# Patient Record
Sex: Female | Born: 1960 | Race: White | Hispanic: No | Marital: Married | Smoking: Former smoker
Health system: Southern US, Community
[De-identification: ages and names within clinical notes are randomized; demographics above are authoritative.]

## PROBLEM LIST (undated history)

## (undated) DIAGNOSIS — E119 Type 2 diabetes mellitus without complications: Secondary | ICD-10-CM

## (undated) DIAGNOSIS — F32A Depression, unspecified: Secondary | ICD-10-CM

## (undated) DIAGNOSIS — K219 Gastro-esophageal reflux disease without esophagitis: Secondary | ICD-10-CM

## (undated) DIAGNOSIS — F419 Anxiety disorder, unspecified: Secondary | ICD-10-CM

## (undated) DIAGNOSIS — R011 Cardiac murmur, unspecified: Secondary | ICD-10-CM

## (undated) DIAGNOSIS — M722 Plantar fascial fibromatosis: Secondary | ICD-10-CM

## (undated) DIAGNOSIS — G4730 Sleep apnea, unspecified: Secondary | ICD-10-CM

## (undated) DIAGNOSIS — E785 Hyperlipidemia, unspecified: Secondary | ICD-10-CM

## (undated) DIAGNOSIS — I10 Essential (primary) hypertension: Secondary | ICD-10-CM

## (undated) HISTORY — DX: Essential (primary) hypertension: I10

## (undated) HISTORY — DX: Plantar fascial fibromatosis: M72.2

## (undated) HISTORY — DX: Hyperlipidemia, unspecified: E78.5

## (undated) HISTORY — PX: BARIATRIC SURGERY: SHX1103

## (undated) HISTORY — DX: Type 2 diabetes mellitus without complications: E11.9

## (undated) HISTORY — DX: Depression, unspecified: F32.A

## (undated) HISTORY — PX: COLONOSCOPY: SHX174

---

## 1997-11-05 ENCOUNTER — Inpatient Hospital Stay (HOSPITAL_COMMUNITY): Admission: AD | Admit: 1997-11-05 | Discharge: 1997-11-08 | Payer: Self-pay | Admitting: Obstetrics and Gynecology

## 1998-12-15 ENCOUNTER — Other Ambulatory Visit: Admission: RE | Admit: 1998-12-15 | Discharge: 1998-12-15 | Payer: Self-pay | Admitting: Obstetrics and Gynecology

## 1999-12-05 ENCOUNTER — Other Ambulatory Visit: Admission: RE | Admit: 1999-12-05 | Discharge: 1999-12-05 | Payer: Self-pay | Admitting: Obstetrics and Gynecology

## 2001-01-30 ENCOUNTER — Other Ambulatory Visit: Admission: RE | Admit: 2001-01-30 | Discharge: 2001-01-30 | Payer: Self-pay | Admitting: Obstetrics and Gynecology

## 2002-03-12 ENCOUNTER — Encounter: Payer: Self-pay | Admitting: Obstetrics and Gynecology

## 2002-03-12 ENCOUNTER — Other Ambulatory Visit: Admission: RE | Admit: 2002-03-12 | Discharge: 2002-03-12 | Payer: Self-pay | Admitting: Obstetrics and Gynecology

## 2002-03-12 ENCOUNTER — Ambulatory Visit (HOSPITAL_COMMUNITY): Admission: RE | Admit: 2002-03-12 | Discharge: 2002-03-12 | Payer: Self-pay | Admitting: Obstetrics and Gynecology

## 2003-03-16 ENCOUNTER — Other Ambulatory Visit: Admission: RE | Admit: 2003-03-16 | Discharge: 2003-03-16 | Payer: Self-pay | Admitting: Obstetrics and Gynecology

## 2003-03-16 ENCOUNTER — Ambulatory Visit (HOSPITAL_COMMUNITY): Admission: RE | Admit: 2003-03-16 | Discharge: 2003-03-16 | Payer: Self-pay | Admitting: Obstetrics and Gynecology

## 2005-08-10 ENCOUNTER — Ambulatory Visit (HOSPITAL_COMMUNITY): Admission: RE | Admit: 2005-08-10 | Discharge: 2005-08-10 | Payer: Self-pay | Admitting: Obstetrics and Gynecology

## 2007-07-28 ENCOUNTER — Ambulatory Visit (HOSPITAL_COMMUNITY): Admission: RE | Admit: 2007-07-28 | Discharge: 2007-07-28 | Payer: Self-pay | Admitting: Obstetrics and Gynecology

## 2008-08-02 ENCOUNTER — Ambulatory Visit (HOSPITAL_COMMUNITY): Admission: RE | Admit: 2008-08-02 | Discharge: 2008-08-02 | Payer: Self-pay | Admitting: Obstetrics and Gynecology

## 2009-10-03 ENCOUNTER — Ambulatory Visit (HOSPITAL_COMMUNITY): Admission: RE | Admit: 2009-10-03 | Discharge: 2009-10-03 | Payer: Self-pay | Admitting: Obstetrics and Gynecology

## 2012-02-13 ENCOUNTER — Ambulatory Visit: Payer: Self-pay | Admitting: Family Medicine

## 2012-02-19 ENCOUNTER — Ambulatory Visit: Payer: Self-pay | Admitting: Family Medicine

## 2012-03-21 ENCOUNTER — Ambulatory Visit: Payer: Self-pay | Admitting: Family Medicine

## 2012-10-27 ENCOUNTER — Other Ambulatory Visit (HOSPITAL_COMMUNITY): Payer: Self-pay | Admitting: Obstetrics and Gynecology

## 2012-10-27 DIAGNOSIS — Z1231 Encounter for screening mammogram for malignant neoplasm of breast: Secondary | ICD-10-CM

## 2012-11-11 ENCOUNTER — Ambulatory Visit (HOSPITAL_COMMUNITY): Payer: Self-pay

## 2012-11-17 ENCOUNTER — Ambulatory Visit (HOSPITAL_COMMUNITY)
Admission: RE | Admit: 2012-11-17 | Discharge: 2012-11-17 | Disposition: A | Discharge status: Home / Self Care | Payer: BC Managed Care – PPO | Source: Ambulatory Visit | Attending: Obstetrics and Gynecology | Admitting: Obstetrics and Gynecology

## 2012-11-17 ENCOUNTER — Other Ambulatory Visit (HOSPITAL_COMMUNITY): Payer: Self-pay | Admitting: Family Medicine

## 2012-11-17 DIAGNOSIS — Z1231 Encounter for screening mammogram for malignant neoplasm of breast: Secondary | ICD-10-CM | POA: Insufficient documentation

## 2013-11-03 ENCOUNTER — Ambulatory Visit: Payer: Self-pay | Admitting: Gastroenterology

## 2013-11-06 LAB — PATHOLOGY REPORT

## 2013-12-30 DIAGNOSIS — Z9884 Bariatric surgery status: Secondary | ICD-10-CM | POA: Insufficient documentation

## 2014-03-16 ENCOUNTER — Other Ambulatory Visit (HOSPITAL_COMMUNITY): Payer: Self-pay | Admitting: Internal Medicine

## 2014-03-16 DIAGNOSIS — Z1231 Encounter for screening mammogram for malignant neoplasm of breast: Secondary | ICD-10-CM

## 2014-03-26 ENCOUNTER — Ambulatory Visit (HOSPITAL_COMMUNITY)
Admission: RE | Admit: 2014-03-26 | Discharge: 2014-03-26 | Disposition: A | Discharge status: Home / Self Care | Payer: BC Managed Care – PPO | Source: Ambulatory Visit | Attending: Internal Medicine | Admitting: Internal Medicine

## 2014-03-26 DIAGNOSIS — Z1231 Encounter for screening mammogram for malignant neoplasm of breast: Secondary | ICD-10-CM | POA: Diagnosis not present

## 2014-10-27 ENCOUNTER — Ambulatory Visit: Payer: BLUE CROSS/BLUE SHIELD | Admitting: Speech Pathology

## 2015-11-07 ENCOUNTER — Other Ambulatory Visit: Payer: Self-pay | Admitting: Internal Medicine

## 2015-11-07 DIAGNOSIS — Z1231 Encounter for screening mammogram for malignant neoplasm of breast: Secondary | ICD-10-CM

## 2015-11-23 ENCOUNTER — Ambulatory Visit: Payer: BLUE CROSS/BLUE SHIELD | Attending: Internal Medicine

## 2016-02-10 ENCOUNTER — Other Ambulatory Visit: Payer: Self-pay | Admitting: Internal Medicine

## 2016-02-10 DIAGNOSIS — Z1231 Encounter for screening mammogram for malignant neoplasm of breast: Secondary | ICD-10-CM

## 2016-02-21 ENCOUNTER — Ambulatory Visit
Admission: RE | Admit: 2016-02-21 | Discharge: 2016-02-21 | Disposition: A | Discharge status: Home / Self Care | Payer: BLUE CROSS/BLUE SHIELD | Source: Ambulatory Visit | Attending: Internal Medicine | Admitting: Internal Medicine

## 2016-02-21 ENCOUNTER — Other Ambulatory Visit: Payer: Self-pay | Admitting: Internal Medicine

## 2016-02-21 DIAGNOSIS — Z1231 Encounter for screening mammogram for malignant neoplasm of breast: Secondary | ICD-10-CM | POA: Diagnosis present

## 2017-04-09 ENCOUNTER — Other Ambulatory Visit: Payer: Self-pay | Admitting: Internal Medicine

## 2017-04-09 DIAGNOSIS — Z1231 Encounter for screening mammogram for malignant neoplasm of breast: Secondary | ICD-10-CM

## 2017-05-03 ENCOUNTER — Ambulatory Visit
Admission: RE | Admit: 2017-05-03 | Discharge: 2017-05-03 | Disposition: A | Discharge status: Home / Self Care | Payer: BLUE CROSS/BLUE SHIELD | Source: Ambulatory Visit | Attending: Internal Medicine | Admitting: Internal Medicine

## 2017-05-03 DIAGNOSIS — Z1231 Encounter for screening mammogram for malignant neoplasm of breast: Secondary | ICD-10-CM | POA: Insufficient documentation

## 2018-02-10 ENCOUNTER — Encounter: Payer: Self-pay | Admitting: Urology

## 2018-02-10 ENCOUNTER — Ambulatory Visit: Payer: Self-pay | Admitting: Urology

## 2018-02-10 DIAGNOSIS — E785 Hyperlipidemia, unspecified: Secondary | ICD-10-CM | POA: Insufficient documentation

## 2018-02-10 DIAGNOSIS — E119 Type 2 diabetes mellitus without complications: Secondary | ICD-10-CM | POA: Insufficient documentation

## 2018-02-10 DIAGNOSIS — N959 Unspecified menopausal and perimenopausal disorder: Secondary | ICD-10-CM | POA: Insufficient documentation

## 2018-02-10 DIAGNOSIS — I10 Essential (primary) hypertension: Secondary | ICD-10-CM | POA: Insufficient documentation

## 2018-02-10 DIAGNOSIS — F329 Major depressive disorder, single episode, unspecified: Secondary | ICD-10-CM | POA: Insufficient documentation

## 2018-02-10 DIAGNOSIS — G4733 Obstructive sleep apnea (adult) (pediatric): Secondary | ICD-10-CM | POA: Insufficient documentation

## 2018-02-10 DIAGNOSIS — K219 Gastro-esophageal reflux disease without esophagitis: Secondary | ICD-10-CM | POA: Insufficient documentation

## 2018-02-10 DIAGNOSIS — F32A Depression, unspecified: Secondary | ICD-10-CM | POA: Insufficient documentation

## 2018-02-10 HISTORY — DX: Morbid (severe) obesity due to excess calories: E66.01

## 2018-02-10 NOTE — Progress Notes (Deleted)
02/10/2018 5:55 AM   Sandra Le 1960/10/25 [id number]  Referring provider: Margaretann Loveless, MD [address] [place], [place] [address]  No chief complaint on file.   HPI: Patient is a 57 -year-old Caucasian female who is referred to [place] by Dr. Margaretann Loveless for recurrent urinary tract infections.  Patient states that she has had *** urinary tract infections over the last year.  Reviewing her records,  she has had no documented positive urine culture.    Her symptoms with a urinary tract infection consist of ***.  She was found to have 3-10 RBC's on UA with negative urine culture.    She denies/endorses dysuria, gross hematuria, suprapubic pain, back pain, abdominal pain or flank pain associated with UTI's.    She has not had any recent fevers, chills, nausea or vomiting associated with UTI's.   She does/does not have a history of nephrolithiasis, GU surgery or GU trauma. ***  She is/is not sexually active.  She has/has not noted a correlation with her urinary tract infections and sexual intercourse.  ***   She does/does not engage in anal sex. ***  She is/ is not having anal to vaginal sex.*** She is/is not voiding before and after sex. ***     She is/is not postmenopausal. ***  She admits to/denies constipation and/or diarrhea. ***  She does/does not use tampons.  She does/does not engage in good perineal hygiene. She does/does not take tub baths. ***  She has/does not have incontinence.  She is using incontinence pads. ***  She is having/ not having pain with bladder filling.  ***  She has/not had any recent imaging studies.  ***  She is drinking *** of water daily.     Reviewed referral notes.    PMH: No past medical history on file.  Surgical History: No past surgical history on file.  Home Medications:  Allergies as of 02/10/2018   Not on File     Medication List    as of 02/10/2018  5:55 AM   You have not been prescribed any medications.      Allergies: Allergies not on file  Family History: Family History  Problem Relation Age of Onset  . Breast cancer Neg Hx     Social History:  has no tobacco, alcohol, and drug history on file.  ROS:                                        Physical Exam: There were no vitals taken for this visit.  Constitutional:  Well nourished. Alert and oriented, No acute distress. HEENT: [place] AT, moist mucus membranes.  Trachea midline, no masses. Cardiovascular: No clubbing, cyanosis, or edema. Respiratory: Normal respiratory effort, no increased work of breathing. GI: Abdomen is soft, non tender, non distended, no abdominal masses. Liver and spleen not palpable.  No hernias appreciated.  Stool sample for occult testing is not indicated.   GU: No CVA tenderness.  No bladder fullness or masses.  Normal external genitalia, normal pubic hair distribution, no lesions.  Normal urethral meatus, no lesions, no prolapse, no discharge.   No urethral masses, tenderness and/or tenderness. No bladder fullness, tenderness or masses. Normal vagina mucosa, good estrogen effect, no discharge, no lesions, good pelvic support, no cystocele or rectocele noted.  No cervical motion tenderness.  Uterus is freely mobile and non-fixed.  No adnexal/parametria  masses or tenderness noted.  Anus and perineum are without rashes or lesions.   *** Skin: No rashes, bruises or suspicious lesions. Lymph: No cervical or inguinal adenopathy. Neurologic: Grossly intact, no focal deficits, moving all 4 extremities. Psychiatric: Normal mood and affect.  Laboratory Data: No results found for: WBC, HGB, HCT, MCV, PLT  No results found for: CREATININE  No results found for: PSA  No results found for: TESTOSTERONE  No results found for: HGBA1C  No results found for: TSH  No results found for: CHOL, HDL, CHOLHDL, VLDL, LDLCALC  No results found for: AST No results found for: ALT No components found for:  ALKALINEPHOPHATASE No components found for: BILIRUBINTOTAL  No results found for: ESTRADIOL  Urinalysis No results found for: COLORURINE, APPEARANCEUR, LABSPEC, PHURINE, GLUCOSEU, HGBUR, BILIRUBINUR, KETONESUR, PROTEINUR, UROBILINOGEN, NITRITE, LEUKOCYTESUR  I have reviewed the labs.   Pertinent Imaging: *** I have independently reviewed the films.    Assessment & Plan:  ***  1. Microscopic hematuria  - I explained to the patient that there are a number of causes that can be associated with blood in the urine, such as stones, *** BPH, UTI's, damage to the urinary tract and/or cancer.  - At this time, I felt that the patient warranted further urologic evaluation with 3 or greater RBC's/hpf on microscopic evaluation of the urine.  The AUA guidelines state that a CT urogram is the preferred imaging study to evaluate hematuria.  - I explained to the patient that a contrast material will be injected into a vein and that in rare instances, an allergic reaction can result and may even life threatening   The patient denies any allergies to contrast***, iodine and/or seafood*** and is not taking metformin.***  - Her reproductive status is hysterectomy, postmenopausal, tubal ligation are unknown at this time.  We will obtain a serum pregnancy test today. ***  - On occasion, we may need to resort to a non-contrast study such as a renal ultrasound or a non-contrast CT if the patient has a contrast allergy or renal insufficiency.  In the latter case,  I told him/her *** that an upper tract study without contrast will lack the detail of excluding some urologic tumors.  Because of this, he/she would need to undergo cystoscopy with bilateral retrogrades in the OR to complete the hematuria workup in addition to the imaging studies. ***  - Following the imaging study,  I've recommended a cystoscopy. I described how this is performed, typically in an office setting with a flexible cystoscope. We described the  risks, benefits, and possible side effects, the most common of which is a minor amount of blood in the urine and/or burning which usually resolves in 24 to 48 hours.  ***  - The patient had the opportunity to ask questions which were answered. Based upon this discussion, the patient is willing to proceed. Therefore, I've ordered: a CT Urogram and cystoscopy.  - The patient will return following all of the above for discussion of the results.   - UA  - Urine culture  - BUN + creatinine    - if testing returns without finding an etiology for the hematuria, we will need to see the patient back yearly -if they do not have any recurrent gross hematuria or AMH they may be released from our care - if gross hematuria or AMH persist may consider referral to nephrology - if gross hematuria or AMH persists beyond two years - may consider to repeat studies  based on patient's risk factors for cancer    No follow-ups on file.  These notes generated with voice recognition software. I apologize for typographical errors.  Michiel CowboySHANNON Timothy Townsel, PA-C  [facility] [address]  [address] [place], [place] [address] [phone]

## 2018-03-06 ENCOUNTER — Other Ambulatory Visit: Payer: Self-pay | Admitting: Internal Medicine

## 2018-03-06 DIAGNOSIS — Z1231 Encounter for screening mammogram for malignant neoplasm of breast: Secondary | ICD-10-CM

## 2018-04-21 ENCOUNTER — Ambulatory Visit (INDEPENDENT_AMBULATORY_CARE_PROVIDER_SITE_OTHER): Payer: BLUE CROSS/BLUE SHIELD | Admitting: Urology

## 2018-04-21 ENCOUNTER — Encounter: Payer: Self-pay | Admitting: Urology

## 2018-04-21 VITALS — BP 132/86 | HR 80 | Ht 65.0 in | Wt 202.0 lb

## 2018-04-21 DIAGNOSIS — N390 Urinary tract infection, site not specified: Secondary | ICD-10-CM | POA: Diagnosis not present

## 2018-04-21 DIAGNOSIS — R3129 Other microscopic hematuria: Secondary | ICD-10-CM | POA: Diagnosis not present

## 2018-04-21 LAB — URINALYSIS, COMPLETE
Bilirubin, UA: NEGATIVE
GLUCOSE, UA: NEGATIVE
KETONES UA: NEGATIVE
Nitrite, UA: NEGATIVE
PROTEIN UA: NEGATIVE
SPEC GRAV UA: 1.02 (ref 1.005–1.030)
UUROB: 0.2 mg/dL (ref 0.2–1.0)
pH, UA: 5.5 (ref 5.0–7.5)

## 2018-04-21 LAB — MICROSCOPIC EXAMINATION

## 2018-04-21 LAB — BLADDER SCAN AMB NON-IMAGING: Scan Result: 28

## 2018-04-21 NOTE — Progress Notes (Signed)
04/21/2018 1:32 PM   Sandra GandyConnie E Le 11/11/1960 [id number]  Referring provider: Margaretann LovelessKhan, Neelam S, MD [address] [place], [place] [address]  Chief Complaint  Patient presents with  . Recurrent UTI    HPI: Consulted to assist the patient'Le microscopic hematuria and possibly bladder infections.  She says sometimes she feels irritated or dry and possibly some cramps but otherwise says she does not have a lot of bladder infection symptoms.  She describes positive cultures of microscopic hematuria.  Once she saw little bit of blood with wiping but thought it was due to dryness  At baseline she voids every 3 hours gets up once a night and is continent.  She has a smoking history.  She does not take daily aspirin or blood thinners  She denies history of kidney stones previous to surgery and she is on oral hypoglycemics.  She has had a hysterectomy  Modifying factors: There are no other modifying factors  Associated signs and symptoms: There are no other associated signs and symptoms Aggravating and relieving factors: There are no other aggravating or relieving factors Severity: Moderate Duration: Persistent   PMH: Past Medical History:  Diagnosis Date  . Hyperlipidemia   . Hypertension     Surgical History: Past Surgical History:  Procedure Laterality Date  . BARIATRIC SURGERY    . CESAREAN SECTION      Home Medications:  Allergies as of 04/21/2018   No Known Allergies     Medication List        Accurate as of 04/21/18  1:32 PM. Always use your most recent med list.          acetaminophen 500 MG tablet Commonly known as:  TYLENOL Take by mouth.   Azelastine HCl 0.15 % Soln Place into the nose.   busPIRone 10 MG tablet Commonly known as:  BUSPAR   calcium-vitamin D 500-200 MG-UNIT Tabs tablet Commonly known as:  OSCAL WITH D Take by mouth.   DULoxetine 60 MG capsule Commonly known as:  CYMBALTA   fexofenadine-pseudoephedrine 60-120 MG 12 hr  tablet Commonly known as:  ALLEGRA-D Take by mouth.   ketoconazole 2 % cream Commonly known as:  NIZORAL   lisinopril 5 MG tablet Commonly known as:  PRINIVIL,ZESTRIL   metFORMIN 1000 MG tablet Commonly known as:  GLUCOPHAGE   multivitamin capsule Take by mouth.   omeprazole 40 MG capsule Commonly known as:  PRILOSEC   PREMPRO 0.3-1.5 MG tablet Generic drug:  estrogen (conjugated)-medroxyprogesterone   simvastatin 40 MG tablet Commonly known as:  ZOCOR Take by mouth.   sulfamethoxazole-trimethoprim 800-160 MG tablet Commonly known as:  BACTRIM DS,SEPTRA DS   terconazole 0.8 % vaginal cream Commonly known as:  TERAZOL 3       Allergies: No Known Allergies  Family History: Family History  Problem Relation Age of Onset  . Breast cancer Neg Hx   . Bladder Cancer Neg Hx   . Kidney cancer Neg Hx     Social History:  reports that she has quit smoking. She has never used smokeless tobacco. She reports that she drinks alcohol. Her drug history is not on file.  ROS: UROLOGY Frequent Urination?: Yes Hard to postpone urination?: No Burning/pain with urination?: No Get up at night to urinate?: Yes Leakage of urine?: No Urine stream starts and stops?: No Trouble starting stream?: No Do you have to strain to urinate?: No Blood in urine?: Yes Urinary tract infection?: Yes Sexually transmitted disease?: No Injury to kidneys or  bladder?: No Painful intercourse?: No Weak stream?: No Currently pregnant?: No Vaginal bleeding?: No Last menstrual period?: n  Gastrointestinal Nausea?: No Vomiting?: No Indigestion/heartburn?: No Diarrhea?: No Constipation?: Yes  Constitutional Fever: No Night sweats?: No Weight loss?: No Fatigue?: No  Skin Skin rash/lesions?: Yes Itching?: Yes  Eyes Blurred vision?: No Double vision?: No  Ears/Nose/Throat Sore throat?: No Sinus problems?: No  Hematologic/Lymphatic Swollen glands?: No Easy bruising?:  No  Cardiovascular Leg swelling?: No Chest pain?: No  Respiratory Cough?: No Shortness of breath?: No  Endocrine Excessive thirst?: No  Musculoskeletal Back pain?: No Joint pain?: No  Neurological Headaches?: No Dizziness?: No  Psychologic Depression?: Yes Anxiety?: Yes  Physical Exam: BP 132/86 (BP Location: Left Arm, Patient Position: Sitting, Cuff Size: Normal)   Pulse 80   Ht 5\' 5"  (1.651 m)   Wt 91.6 kg   BMI 33.61 kg/m   Constitutional:  Alert and oriented, No acute distress. HEENT: [place] AT, moist mucus membranes.  Trachea midline, no masses. Cardiovascular: No clubbing, cyanosis, or edema. Respiratory: Normal respiratory effort, no increased work of breathing. GI: Abdomen is soft, nontender, nondistended, no abdominal masses GU: No CVA tenderness.  No bladder tenderness Skin: No rashes, bruises or suspicious lesions. Lymph: No cervical or inguinal adenopathy. Neurologic: Grossly intact, no focal deficits, moving all 4 extremities. Psychiatric: Normal mood and affect.  Laboratory Data: No results found for: WBC, HGB, HCT, MCV, PLT  No results found for: CREATININE  No results found for: PSA  No results found for: TESTOSTERONE  No results found for: HGBA1C  Urinalysis No results found for: COLORURINE, APPEARANCEUR, LABSPEC, PHURINE, GLUCOSEU, HGBUR, BILIRUBINUR, KETONESUR, PROTEINUR, UROBILINOGEN, NITRITE, LEUKOCYTESUR  Pertinent Imaging:   Assessment & Plan: The patient may or may not be having true bacterial cystitis.  Certainly should be worked up for microscopic hematuria.  It would be reasonable to try urinary prophylaxis in the future especially if she has had positive cultures since May.  I cannot find the cultures today.  CT scan ordered.  Return for pelvic examination and cystoscopy.  Culture sent.  Pathophysiology of chronic cystitis discussed.  It may be related to her vaginal dryness that she also reports  1. Recurrent UTI 2.  Microscopic  hematuria - Urinalysis, Complete - CULTURE, URINE COMPREHENSIVE - BLADDER SCAN AMB NON-IMAGING   No follow-ups on file.  Sandra Sinner, MD  [facility] [address] [place], [place] [address] [phone]

## 2018-04-23 ENCOUNTER — Telehealth: Payer: Self-pay

## 2018-04-23 NOTE — Telephone Encounter (Signed)
Marena ChancyBianca with [facility] called to speak with Marcelino DusterMichelle about pt CT Scan. Please give her a call at [phone], option 2, then option 3, reference number: [id number]  Pt is wanting to use diagnostic radiology for her ct scan. They are requesting you send the information there instead.   Fax number for [facility] is [phone]

## 2018-04-23 NOTE — Telephone Encounter (Signed)
I have changed it  Marcelino DusterMichelle

## 2018-04-25 LAB — CULTURE, URINE COMPREHENSIVE

## 2018-05-05 ENCOUNTER — Ambulatory Visit
Admission: RE | Admit: 2018-05-05 | Discharge: 2018-05-05 | Disposition: A | Discharge status: Home / Self Care | Payer: BLUE CROSS/BLUE SHIELD | Source: Ambulatory Visit | Attending: Urology | Admitting: Urology

## 2018-05-05 DIAGNOSIS — R3129 Other microscopic hematuria: Secondary | ICD-10-CM

## 2018-05-05 DIAGNOSIS — N390 Urinary tract infection, site not specified: Secondary | ICD-10-CM

## 2018-05-05 MED ORDER — IOPAMIDOL (ISOVUE-300) INJECTION 61%
125.0000 mL | Freq: Once | INTRAVENOUS | Status: AC | PRN
  Administered 2018-05-05: 125 mL via INTRAVENOUS

## 2018-05-15 ENCOUNTER — Ambulatory Visit
Admission: RE | Admit: 2018-05-15 | Discharge: 2018-05-15 | Disposition: A | Discharge status: Home / Self Care | Payer: BLUE CROSS/BLUE SHIELD | Source: Ambulatory Visit | Attending: Internal Medicine | Admitting: Internal Medicine

## 2018-05-15 DIAGNOSIS — Z1231 Encounter for screening mammogram for malignant neoplasm of breast: Secondary | ICD-10-CM | POA: Diagnosis not present

## 2018-05-19 ENCOUNTER — Ambulatory Visit: Payer: BLUE CROSS/BLUE SHIELD | Admitting: Urology

## 2018-05-19 ENCOUNTER — Encounter: Payer: Self-pay | Admitting: Urology

## 2018-05-19 VITALS — BP 133/80 | HR 83 | Ht 65.0 in

## 2018-05-19 DIAGNOSIS — N390 Urinary tract infection, site not specified: Secondary | ICD-10-CM

## 2018-05-19 LAB — MICROSCOPIC EXAMINATION

## 2018-05-19 LAB — URINALYSIS, COMPLETE
Bilirubin, UA: NEGATIVE
Glucose, UA: NEGATIVE
Ketones, UA: NEGATIVE
NITRITE UA: NEGATIVE
PH UA: 6 (ref 5.0–7.5)
PROTEIN UA: NEGATIVE
Specific Gravity, UA: 1.02 (ref 1.005–1.030)
UUROB: 0.2 mg/dL (ref 0.2–1.0)

## 2018-05-19 NOTE — Progress Notes (Signed)
05/19/2018 9:42 AM   Berneice Gandyonnie E Pressman 01/23/1961 [id number]  Referring provider: Margaretann LovelessKhan, Neelam S, MD [address] [place], [place] [address]  Chief Complaint  Patient presents with  . Cysto    HPI: Consulted to assist the patient's microscopic hematuria and possibly bladder infections.  She says sometimes she feels irritated or dry and possibly some cramps but otherwise says she does not have a lot of bladder infection symptoms.  She describes positive cultures of microscopic hematuria.  Once she saw little bit of blood with wiping but thought it was due to dryness  At baseline she voids every 3 hours gets up once a night and is continent.  She has a smoking history.  She does not take daily aspirin or blood thinners    The patient may or may not be having true bacterial cystitis.  Certainly should be worked up for microscopic hematuria.  It would be reasonable to try urinary prophylaxis in the future especially if she has had positive cultures since May.  I cannot find the cultures today.  CT scan ordered.  Return for pelvic examination and cystoscopy.  Culture sent.  Pathophysiology of chronic cystitis discussed.  It may be related to her vaginal dryness that she also reports  Today Frequency is stable.  CT scan was normal.  Urine culture from December negative. No more infections On pelvic examination mild hypermobility the urethra with a negative cough test. Cystoscopy: Patient underwent flexible cystoscopy utilizing sterile technique.  Bladder mucosa and trigone were normal.  Efflux was normal.  Patient tolerated procedure well.  There is no carcinoma or cystitis.   PMH: Past Medical History:  Diagnosis Date  . Hyperlipidemia   . Hypertension     Surgical History: Past Surgical History:  Procedure Laterality Date  . BARIATRIC SURGERY    . CESAREAN SECTION      Home Medications:  Allergies as of 05/19/2018   No Known Allergies     Medication List       Accurate as of May 19, 2018  9:42 AM. Always use your most recent med list.        acetaminophen 500 MG tablet Commonly known as:  TYLENOL Take by mouth.   Azelastine HCl 0.15 % Soln Place into the nose.   busPIRone 10 MG tablet Commonly known as:  BUSPAR   calcium-vitamin D 500-200 MG-UNIT Tabs tablet Commonly known as:  OSCAL WITH D Take by mouth.   DULoxetine 60 MG capsule Commonly known as:  CYMBALTA   fexofenadine-pseudoephedrine 60-120 MG 12 hr tablet Commonly known as:  ALLEGRA-D Take by mouth.   ketoconazole 2 % cream Commonly known as:  NIZORAL   lisinopril 5 MG tablet Commonly known as:  PRINIVIL,ZESTRIL   metFORMIN 1000 MG tablet Commonly known as:  GLUCOPHAGE   multivitamin capsule Take by mouth.   omeprazole 40 MG capsule Commonly known as:  PRILOSEC   PREMPRO 0.3-1.5 MG tablet Generic drug:  estrogen (conjugated)-medroxyprogesterone   simvastatin 40 MG tablet Commonly known as:  ZOCOR Take by mouth.   sulfamethoxazole-trimethoprim 800-160 MG tablet Commonly known as:  BACTRIM DS,SEPTRA DS   terconazole 0.8 % vaginal cream Commonly known as:  TERAZOL 3       Allergies: No Known Allergies  Family History: Family History  Problem Relation Age of Onset  . Breast cancer Neg Hx   . Bladder Cancer Neg Hx   . Kidney cancer Neg Hx     Social History:  reports that  she has quit smoking. She has never used smokeless tobacco. She reports current alcohol use. No history on file for drug.  ROS:                                        Physical Exam: There were no vitals taken for this visit.  Constitutional:  Alert and oriented, No acute distress.   Laboratory Data: No results found for: WBC, HGB, HCT, MCV, PLT  No results found for: CREATININE  No results found for: PSA  No results found for: TESTOSTERONE  No results found for: HGBA1C  Urinalysis    Component Value Date/Time   APPEARANCEUR Cloudy (A)  04/21/2018 1308   GLUCOSEU Negative 04/21/2018 1308   BILIRUBINUR Negative 04/21/2018 1308   PROTEINUR Negative 04/21/2018 1308   NITRITE Negative 04/21/2018 1308   LEUKOCYTESUR 1+ (A) 04/21/2018 1308    Pertinent Imaging:   Assessment & Plan: The patient has been cleared for microscopic hematuria.  I do not think there is enough evidence yet to place her on prophylaxis.  I mentioned this to her.  If she starts getting positive cultures we can always do this or treat them as needed.  I will see her as needed  1. Recurrent UTI  - Urinalysis, Complete   No follow-ups on file.  Martina SinnerScott A Virgie Chery, MD  [facility] [address] [place], [place] [address] [phone]

## 2018-05-26 ENCOUNTER — Other Ambulatory Visit: Payer: BLUE CROSS/BLUE SHIELD | Admitting: Urology

## 2019-02-17 ENCOUNTER — Other Ambulatory Visit: Payer: Self-pay

## 2019-02-17 DIAGNOSIS — Z20822 Contact with and (suspected) exposure to covid-19: Secondary | ICD-10-CM

## 2019-02-18 LAB — NOVEL CORONAVIRUS, NAA: SARS-CoV-2, NAA: DETECTED — AB

## 2019-09-18 ENCOUNTER — Ambulatory Visit: Payer: Self-pay | Attending: Internal Medicine

## 2019-09-18 DIAGNOSIS — Z23 Encounter for immunization: Secondary | ICD-10-CM

## 2019-09-18 NOTE — Progress Notes (Signed)
   Covid-19 Vaccination Clinic  Name:  Sandra Le    MRN: [id number] DOB: 1960/12/25  09/18/2019  [name] was observed post Covid-19 immunization for 15 minutes without incident. She was provided with Vaccine Information Sheet and instruction to access the V-Safe system.   [name] was instructed to call 911 with any severe reactions post vaccine: Marland Kitchen Difficulty breathing  . Swelling of face and throat  . A fast heartbeat  . A bad rash all over body  . Dizziness and weakness   Immunizations Administered    Name Date Dose VIS Date Route   Pfizer COVID-19 Vaccine 09/18/2019 11:50 AM 0.3 mL 07/15/2018 Intramuscular   Manufacturer: [employer], [employer]   Lot: [id number]   NDC: [address]-0

## 2019-10-13 ENCOUNTER — Ambulatory Visit: Payer: Self-pay | Attending: Internal Medicine

## 2019-10-13 DIAGNOSIS — Z23 Encounter for immunization: Secondary | ICD-10-CM

## 2019-10-13 NOTE — Progress Notes (Signed)
   Covid-19 Vaccination Clinic  Name:  Sandra Le    MRN: [id number] DOB: 12-27-1960  10/13/2019  [name] was observed post Covid-19 immunization for 15 minutes without incident. She was provided with Vaccine Information Sheet and instruction to access the V-Safe system.   [name] was instructed to call 911 with any severe reactions post vaccine: Marland Kitchen Difficulty breathing  . Swelling of face and throat  . A fast heartbeat  . A bad rash all over body  . Dizziness and weakness   Immunizations Administered    Name Date Dose VIS Date Route   Pfizer COVID-19 Vaccine 10/13/2019 10:14 AM 0.3 mL 07/15/2018 Intramuscular   Manufacturer: [employer], [employer]   Lot: [id number]   NDC: [address]-5

## 2021-01-17 ENCOUNTER — Other Ambulatory Visit: Payer: Self-pay | Admitting: Internal Medicine

## 2021-01-17 DIAGNOSIS — Z1231 Encounter for screening mammogram for malignant neoplasm of breast: Secondary | ICD-10-CM

## 2021-02-01 ENCOUNTER — Other Ambulatory Visit: Payer: Self-pay

## 2021-02-01 ENCOUNTER — Ambulatory Visit
Admission: RE | Admit: 2021-02-01 | Discharge: 2021-02-01 | Disposition: A | Discharge status: Home / Self Care | Payer: 59 | Source: Ambulatory Visit | Attending: Internal Medicine | Admitting: Internal Medicine

## 2021-02-01 DIAGNOSIS — Z1231 Encounter for screening mammogram for malignant neoplasm of breast: Secondary | ICD-10-CM | POA: Diagnosis present

## 2022-01-23 DIAGNOSIS — F5105 Insomnia due to other mental disorder: Secondary | ICD-10-CM | POA: Diagnosis not present

## 2022-01-23 DIAGNOSIS — F4312 Post-traumatic stress disorder, chronic: Secondary | ICD-10-CM | POA: Diagnosis not present

## 2022-01-23 DIAGNOSIS — R69 Illness, unspecified: Secondary | ICD-10-CM | POA: Diagnosis not present

## 2022-01-23 DIAGNOSIS — F411 Generalized anxiety disorder: Secondary | ICD-10-CM | POA: Diagnosis not present

## 2022-02-01 DIAGNOSIS — I10 Essential (primary) hypertension: Secondary | ICD-10-CM | POA: Diagnosis not present

## 2022-02-01 DIAGNOSIS — E119 Type 2 diabetes mellitus without complications: Secondary | ICD-10-CM | POA: Diagnosis not present

## 2022-02-01 DIAGNOSIS — E782 Mixed hyperlipidemia: Secondary | ICD-10-CM | POA: Diagnosis not present

## 2022-02-05 ENCOUNTER — Other Ambulatory Visit: Payer: Self-pay | Admitting: Internal Medicine

## 2022-02-05 DIAGNOSIS — E782 Mixed hyperlipidemia: Secondary | ICD-10-CM | POA: Diagnosis not present

## 2022-02-05 DIAGNOSIS — I10 Essential (primary) hypertension: Secondary | ICD-10-CM | POA: Diagnosis not present

## 2022-02-05 DIAGNOSIS — Z1231 Encounter for screening mammogram for malignant neoplasm of breast: Secondary | ICD-10-CM

## 2022-02-05 DIAGNOSIS — R69 Illness, unspecified: Secondary | ICD-10-CM | POA: Diagnosis not present

## 2022-02-05 DIAGNOSIS — E119 Type 2 diabetes mellitus without complications: Secondary | ICD-10-CM | POA: Diagnosis not present

## 2022-02-09 DIAGNOSIS — R69 Illness, unspecified: Secondary | ICD-10-CM | POA: Diagnosis not present

## 2022-02-09 DIAGNOSIS — F411 Generalized anxiety disorder: Secondary | ICD-10-CM | POA: Diagnosis not present

## 2022-02-09 DIAGNOSIS — F5105 Insomnia due to other mental disorder: Secondary | ICD-10-CM | POA: Diagnosis not present

## 2022-02-09 DIAGNOSIS — F4312 Post-traumatic stress disorder, chronic: Secondary | ICD-10-CM | POA: Diagnosis not present

## 2022-02-12 DIAGNOSIS — M6530 Trigger finger, unspecified finger: Secondary | ICD-10-CM | POA: Diagnosis not present

## 2022-02-12 DIAGNOSIS — M65331 Trigger finger, right middle finger: Secondary | ICD-10-CM | POA: Diagnosis not present

## 2022-02-15 DIAGNOSIS — G4733 Obstructive sleep apnea (adult) (pediatric): Secondary | ICD-10-CM | POA: Diagnosis not present

## 2022-02-15 DIAGNOSIS — R053 Chronic cough: Secondary | ICD-10-CM | POA: Diagnosis not present

## 2022-02-15 DIAGNOSIS — J301 Allergic rhinitis due to pollen: Secondary | ICD-10-CM | POA: Diagnosis not present

## 2022-03-01 DIAGNOSIS — F411 Generalized anxiety disorder: Secondary | ICD-10-CM | POA: Diagnosis not present

## 2022-03-01 DIAGNOSIS — R69 Illness, unspecified: Secondary | ICD-10-CM | POA: Diagnosis not present

## 2022-03-01 DIAGNOSIS — F5105 Insomnia due to other mental disorder: Secondary | ICD-10-CM | POA: Diagnosis not present

## 2022-03-01 DIAGNOSIS — F4312 Post-traumatic stress disorder, chronic: Secondary | ICD-10-CM | POA: Diagnosis not present

## 2022-03-12 DIAGNOSIS — E668 Other obesity: Secondary | ICD-10-CM | POA: Diagnosis not present

## 2022-03-12 DIAGNOSIS — E782 Mixed hyperlipidemia: Secondary | ICD-10-CM | POA: Diagnosis not present

## 2022-03-12 DIAGNOSIS — E119 Type 2 diabetes mellitus without complications: Secondary | ICD-10-CM | POA: Diagnosis not present

## 2022-03-12 DIAGNOSIS — I10 Essential (primary) hypertension: Secondary | ICD-10-CM | POA: Diagnosis not present

## 2022-03-22 DIAGNOSIS — G4733 Obstructive sleep apnea (adult) (pediatric): Secondary | ICD-10-CM | POA: Diagnosis not present

## 2022-03-22 DIAGNOSIS — R053 Chronic cough: Secondary | ICD-10-CM | POA: Diagnosis not present

## 2022-03-22 DIAGNOSIS — R040 Epistaxis: Secondary | ICD-10-CM | POA: Diagnosis not present

## 2022-03-28 DIAGNOSIS — F411 Generalized anxiety disorder: Secondary | ICD-10-CM | POA: Diagnosis not present

## 2022-03-28 DIAGNOSIS — F5105 Insomnia due to other mental disorder: Secondary | ICD-10-CM | POA: Diagnosis not present

## 2022-03-28 DIAGNOSIS — F4312 Post-traumatic stress disorder, chronic: Secondary | ICD-10-CM | POA: Diagnosis not present

## 2022-03-28 DIAGNOSIS — R69 Illness, unspecified: Secondary | ICD-10-CM | POA: Diagnosis not present

## 2022-04-11 DIAGNOSIS — F411 Generalized anxiety disorder: Secondary | ICD-10-CM | POA: Diagnosis not present

## 2022-04-11 DIAGNOSIS — R69 Illness, unspecified: Secondary | ICD-10-CM | POA: Diagnosis not present

## 2022-04-11 DIAGNOSIS — F5105 Insomnia due to other mental disorder: Secondary | ICD-10-CM | POA: Diagnosis not present

## 2022-04-11 DIAGNOSIS — F4312 Post-traumatic stress disorder, chronic: Secondary | ICD-10-CM | POA: Diagnosis not present

## 2022-04-19 ENCOUNTER — Ambulatory Visit
Admission: RE | Admit: 2022-04-19 | Discharge: 2022-04-19 | Disposition: A | Discharge status: Home / Self Care | Payer: 59 | Source: Ambulatory Visit | Attending: Internal Medicine | Admitting: Internal Medicine

## 2022-04-19 DIAGNOSIS — Z1231 Encounter for screening mammogram for malignant neoplasm of breast: Secondary | ICD-10-CM | POA: Insufficient documentation

## 2022-04-26 DIAGNOSIS — E668 Other obesity: Secondary | ICD-10-CM | POA: Diagnosis not present

## 2022-04-26 DIAGNOSIS — I10 Essential (primary) hypertension: Secondary | ICD-10-CM | POA: Diagnosis not present

## 2022-04-26 DIAGNOSIS — E782 Mixed hyperlipidemia: Secondary | ICD-10-CM | POA: Diagnosis not present

## 2022-04-26 DIAGNOSIS — E119 Type 2 diabetes mellitus without complications: Secondary | ICD-10-CM | POA: Diagnosis not present

## 2022-05-07 DIAGNOSIS — F4312 Post-traumatic stress disorder, chronic: Secondary | ICD-10-CM | POA: Diagnosis not present

## 2022-05-07 DIAGNOSIS — F5105 Insomnia due to other mental disorder: Secondary | ICD-10-CM | POA: Diagnosis not present

## 2022-05-07 DIAGNOSIS — R69 Illness, unspecified: Secondary | ICD-10-CM | POA: Diagnosis not present

## 2022-05-07 DIAGNOSIS — F411 Generalized anxiety disorder: Secondary | ICD-10-CM | POA: Diagnosis not present

## 2022-05-21 DIAGNOSIS — C4491 Basal cell carcinoma of skin, unspecified: Secondary | ICD-10-CM

## 2022-05-21 HISTORY — DX: Basal cell carcinoma of skin, unspecified: C44.91

## 2022-06-07 DIAGNOSIS — E668 Other obesity: Secondary | ICD-10-CM | POA: Diagnosis not present

## 2022-06-07 DIAGNOSIS — E6609 Other obesity due to excess calories: Secondary | ICD-10-CM | POA: Diagnosis not present

## 2022-06-07 DIAGNOSIS — E782 Mixed hyperlipidemia: Secondary | ICD-10-CM | POA: Diagnosis not present

## 2022-06-07 DIAGNOSIS — I10 Essential (primary) hypertension: Secondary | ICD-10-CM | POA: Diagnosis not present

## 2022-06-07 DIAGNOSIS — E119 Type 2 diabetes mellitus without complications: Secondary | ICD-10-CM | POA: Diagnosis not present

## 2022-06-12 DIAGNOSIS — I10 Essential (primary) hypertension: Secondary | ICD-10-CM | POA: Diagnosis not present

## 2022-06-12 DIAGNOSIS — E119 Type 2 diabetes mellitus without complications: Secondary | ICD-10-CM | POA: Diagnosis not present

## 2022-06-12 DIAGNOSIS — R69 Illness, unspecified: Secondary | ICD-10-CM | POA: Diagnosis not present

## 2022-06-12 DIAGNOSIS — E782 Mixed hyperlipidemia: Secondary | ICD-10-CM | POA: Diagnosis not present

## 2022-06-18 DIAGNOSIS — G4733 Obstructive sleep apnea (adult) (pediatric): Secondary | ICD-10-CM | POA: Diagnosis not present

## 2022-06-18 DIAGNOSIS — J320 Chronic maxillary sinusitis: Secondary | ICD-10-CM | POA: Diagnosis not present

## 2022-06-18 DIAGNOSIS — J301 Allergic rhinitis due to pollen: Secondary | ICD-10-CM | POA: Diagnosis not present

## 2022-06-19 DIAGNOSIS — F411 Generalized anxiety disorder: Secondary | ICD-10-CM | POA: Diagnosis not present

## 2022-06-19 DIAGNOSIS — R69 Illness, unspecified: Secondary | ICD-10-CM | POA: Diagnosis not present

## 2022-06-19 DIAGNOSIS — F5105 Insomnia due to other mental disorder: Secondary | ICD-10-CM | POA: Diagnosis not present

## 2022-06-19 DIAGNOSIS — F4312 Post-traumatic stress disorder, chronic: Secondary | ICD-10-CM | POA: Diagnosis not present

## 2022-07-10 ENCOUNTER — Other Ambulatory Visit: Payer: Self-pay

## 2022-07-11 ENCOUNTER — Telehealth: Payer: Self-pay

## 2022-07-11 MED ORDER — PANTOPRAZOLE SODIUM 40 MG PO TBEC: 40.0000 mg | DELAYED_RELEASE_TABLET | Freq: Every day | ORAL | 3 refills | Status: DC

## 2022-07-11 NOTE — Telephone Encounter (Signed)
Pt called regarding rx Trulicity, said unable to get rx due to national back order. She asked if there's an alternative rx you can send for her? Please advise

## 2022-07-24 ENCOUNTER — Ambulatory Visit: Payer: 59 | Admitting: Internal Medicine

## 2022-07-24 ENCOUNTER — Encounter: Payer: Self-pay | Admitting: Internal Medicine

## 2022-07-24 VITALS — BP 120/72 | HR 74 | Ht 65.0 in | Wt 223.0 lb

## 2022-07-24 DIAGNOSIS — G4733 Obstructive sleep apnea (adult) (pediatric): Secondary | ICD-10-CM | POA: Diagnosis not present

## 2022-07-24 DIAGNOSIS — F3289 Other specified depressive episodes: Secondary | ICD-10-CM

## 2022-07-24 DIAGNOSIS — I10 Essential (primary) hypertension: Secondary | ICD-10-CM | POA: Diagnosis not present

## 2022-07-24 DIAGNOSIS — E119 Type 2 diabetes mellitus without complications: Secondary | ICD-10-CM

## 2022-07-24 DIAGNOSIS — E782 Mixed hyperlipidemia: Secondary | ICD-10-CM | POA: Diagnosis not present

## 2022-07-24 DIAGNOSIS — J3089 Other allergic rhinitis: Secondary | ICD-10-CM

## 2022-07-24 LAB — POCT CBG (FASTING - GLUCOSE)-MANUAL ENTRY: Glucose Fasting, POC: 118 mg/dL — AB (ref 70–99)

## 2022-07-24 MED ORDER — TRULICITY 3 MG/0.5ML ~~LOC~~ SOAJ: 3.0000 mg | SUBCUTANEOUS | 0 refills | Status: AC

## 2022-07-24 NOTE — Progress Notes (Signed)
Established Patient Office Visit  Subjective:  Patient ID: Sandra Le, female    DOB: 04/23/1961  Age: 62 y.o. MRN: [id number]  Chief Complaint  Patient presents with   Follow-up    6 week follow up    Patient comes in for her follow-up today.  She is currently on Trulicity injections 1.5 mg/week.  She is tolerating it well but does not show any significant weight loss.  Although her sugar control looks better.  She states the prescription was delayed so she is little behind.  She denies any nausea vomiting, no constipation, no fatigue. She is taking all of her other medications and is trying to control her diet as well as do some exercises. Will plan to increase her Trulicity to 3 mg/week.  Patient will continue the rest of her medications.  Strict diet control also emphasized. Patient is having a lot of nasal and sinus congestion and she is under care of ENT.     Past Medical History:  Diagnosis Date   Hyperlipidemia    Hypertension     Past Surgical History:  Procedure Laterality Date   BARIATRIC SURGERY     CESAREAN SECTION      Social History   Socioeconomic History   Marital status: Married    Spouse name: Not on file   Number of children: Not on file   Years of education: Not on file   Highest education level: Not on file  Occupational History   Not on file  Tobacco Use   Smoking status: Former   Smokeless tobacco: Never  Vaping Use   Vaping Use: Never used  Substance and Sexual Activity   Alcohol use: Yes   Drug use: Not on file   Sexual activity: Not on file  Other Topics Concern   Not on file  Social History Narrative   Not on file   Social Determinants of Health   Financial Resource Strain: Not on file  Food Insecurity: Not on file  Transportation Needs: Not on file  Physical Activity: Not on file  Stress: Not on file  Social Connections: Not on file  Intimate Partner Violence: Not on file    Family History  Problem Relation Age of  Onset   Breast cancer Neg Hx    Bladder Cancer Neg Hx    Kidney cancer Neg Hx     No Known Allergies  Review of Systems  Constitutional: Negative.   HENT: Negative.    Eyes: Negative.   Respiratory: Negative.    Cardiovascular: Negative.   Gastrointestinal: Negative.   Genitourinary: Negative.   Musculoskeletal: Negative.   Skin: Negative.   Neurological: Negative.   Psychiatric/Behavioral: Negative.         Objective:   BP 120/72   Pulse 74   Ht '5\' 5"'$  (1.651 m)   Wt 223 lb (101.2 kg)   SpO2 94%   BMI 37.11 kg/m   Vitals:   07/24/22 1022  BP: 120/72  Pulse: 74  Height: '5\' 5"'$  (1.651 m)  Weight: 223 lb (101.2 kg)  SpO2: 94%  BMI (Calculated): 37.11    Physical Exam Vitals and nursing note reviewed.  Constitutional:      Appearance: Normal appearance. She is obese.  Cardiovascular:     Rate and Rhythm: Normal rate and regular rhythm.  Pulmonary:     Effort: Pulmonary effort is normal.     Breath sounds: Normal breath sounds.  Abdominal:     General: Abdomen is  flat. Bowel sounds are normal.     Palpations: Abdomen is soft.  Musculoskeletal:     Cervical back: Normal range of motion and neck supple.  Neurological:     General: No focal deficit present.     Mental Status: She is alert and oriented to person, place, and time.  Psychiatric:        Mood and Affect: Mood normal.        Behavior: Behavior normal.      Results for orders placed or performed in visit on 07/24/22  POCT CBG (Fasting - Glucose)  Result Value Ref Range   Glucose Fasting, POC 118 (A) 70 - 99 mg/dL    Recent Results (from the past 2160 hour(s))  POCT CBG (Fasting - Glucose)     Status: Abnormal   Collection Time: 07/24/22 10:25 AM  Result Value Ref Range   Glucose Fasting, POC 118 (A) 70 - 99 mg/dL      Assessment & Plan:   Problem List Items Addressed This Visit     Depression   HTN (hypertension)   Hyperlipemia   T2DM (type 2 diabetes mellitus) ([place]) - Primary    Relevant Medications   Dulaglutide (TRULICITY) 3 0000000 SOPN   Other Relevant Orders   POCT CBG (Fasting - Glucose) (Completed)   OSA (obstructive sleep apnea)   Non-seasonal allergic rhinitis    Return in about 6 weeks (around 09/04/2022).   Total time spent: 30 minutes  Perrin Maltese, MD  07/24/2022

## 2022-08-07 ENCOUNTER — Other Ambulatory Visit: Payer: Self-pay | Admitting: Internal Medicine

## 2022-08-07 DIAGNOSIS — I10 Essential (primary) hypertension: Secondary | ICD-10-CM

## 2022-08-13 DIAGNOSIS — F39 Unspecified mood [affective] disorder: Secondary | ICD-10-CM | POA: Diagnosis not present

## 2022-08-13 DIAGNOSIS — F5105 Insomnia due to other mental disorder: Secondary | ICD-10-CM | POA: Diagnosis not present

## 2022-08-13 DIAGNOSIS — F4312 Post-traumatic stress disorder, chronic: Secondary | ICD-10-CM | POA: Diagnosis not present

## 2022-08-13 DIAGNOSIS — F411 Generalized anxiety disorder: Secondary | ICD-10-CM | POA: Diagnosis not present

## 2022-08-14 DIAGNOSIS — F331 Major depressive disorder, recurrent, moderate: Secondary | ICD-10-CM | POA: Diagnosis not present

## 2022-09-03 DIAGNOSIS — F331 Major depressive disorder, recurrent, moderate: Secondary | ICD-10-CM | POA: Diagnosis not present

## 2022-09-04 ENCOUNTER — Other Ambulatory Visit: Payer: Self-pay

## 2022-09-05 ENCOUNTER — Other Ambulatory Visit: Payer: Self-pay | Admitting: Internal Medicine

## 2022-09-05 DIAGNOSIS — E119 Type 2 diabetes mellitus without complications: Secondary | ICD-10-CM

## 2022-09-05 MED ORDER — SIMVASTATIN 20 MG PO TABS: 20.0000 mg | ORAL_TABLET | Freq: Every day | ORAL | 3 refills | Status: DC

## 2022-09-05 MED ORDER — CLONIDINE HCL 0.3 MG PO TABS: 0.3000 mg | ORAL_TABLET | Freq: Every day | ORAL | 3 refills | Status: DC

## 2022-09-10 ENCOUNTER — Other Ambulatory Visit: Payer: 59

## 2022-09-10 DIAGNOSIS — E782 Mixed hyperlipidemia: Secondary | ICD-10-CM | POA: Diagnosis not present

## 2022-09-10 DIAGNOSIS — E1165 Type 2 diabetes mellitus with hyperglycemia: Secondary | ICD-10-CM

## 2022-09-10 DIAGNOSIS — R5383 Other fatigue: Secondary | ICD-10-CM

## 2022-09-11 ENCOUNTER — Encounter: Payer: Self-pay | Admitting: Internal Medicine

## 2022-09-11 ENCOUNTER — Ambulatory Visit (INDEPENDENT_AMBULATORY_CARE_PROVIDER_SITE_OTHER): Payer: 59 | Admitting: Internal Medicine

## 2022-09-11 VITALS — BP 118/70 | HR 76 | Ht 65.0 in | Wt 222.0 lb

## 2022-09-11 DIAGNOSIS — E1165 Type 2 diabetes mellitus with hyperglycemia: Secondary | ICD-10-CM

## 2022-09-11 DIAGNOSIS — I340 Nonrheumatic mitral (valve) insufficiency: Secondary | ICD-10-CM | POA: Diagnosis not present

## 2022-09-11 DIAGNOSIS — F3289 Other specified depressive episodes: Secondary | ICD-10-CM

## 2022-09-11 DIAGNOSIS — R21 Rash and other nonspecific skin eruption: Secondary | ICD-10-CM

## 2022-09-11 DIAGNOSIS — Z13820 Encounter for screening for osteoporosis: Secondary | ICD-10-CM | POA: Diagnosis not present

## 2022-09-11 DIAGNOSIS — G4733 Obstructive sleep apnea (adult) (pediatric): Secondary | ICD-10-CM | POA: Diagnosis not present

## 2022-09-11 DIAGNOSIS — K219 Gastro-esophageal reflux disease without esophagitis: Secondary | ICD-10-CM

## 2022-09-11 DIAGNOSIS — I10 Essential (primary) hypertension: Secondary | ICD-10-CM | POA: Diagnosis not present

## 2022-09-11 DIAGNOSIS — E782 Mixed hyperlipidemia: Secondary | ICD-10-CM

## 2022-09-11 HISTORY — DX: Nonrheumatic mitral (valve) insufficiency: I34.0

## 2022-09-11 LAB — CBC WITH DIFFERENTIAL
Basophils Absolute: 0 10*3/uL (ref 0.0–0.2)
Basos: 0 %
EOS (ABSOLUTE): 0.1 10*3/uL (ref 0.0–0.4)
Eos: 1 %
Hematocrit: 40.6 % (ref 34.0–46.6)
Hemoglobin: 13.9 g/dL (ref 11.1–15.9)
Immature Grans (Abs): 0 10*3/uL (ref 0.0–0.1)
Immature Granulocytes: 0 %
Lymphocytes Absolute: 1.2 10*3/uL (ref 0.7–3.1)
Lymphs: 24 %
MCH: 30.4 pg (ref 26.6–33.0)
MCHC: 34.2 g/dL (ref 31.5–35.7)
MCV: 89 fL (ref 79–97)
Monocytes Absolute: 0.7 10*3/uL (ref 0.1–0.9)
Monocytes: 14 %
Neutrophils Absolute: 3.1 10*3/uL (ref 1.4–7.0)
Neutrophils: 61 %
RBC: 4.57 x10E6/uL (ref 3.77–5.28)
RDW: 12.5 % (ref 11.7–15.4)
WBC: 5.1 10*3/uL (ref 3.4–10.8)

## 2022-09-11 LAB — LIPID PANEL
Chol/HDL Ratio: 2.3 ratio (ref 0.0–4.4)
Cholesterol, Total: 170 mg/dL (ref 100–199)
HDL: 73 mg/dL (ref >39)
LDL Chol Calc (NIH): 75 mg/dL (ref 0–99)
Triglycerides: 131 mg/dL (ref 0–149)
VLDL Cholesterol Cal: 22 mg/dL (ref 5–40)

## 2022-09-11 LAB — CMP14+EGFR
ALT: 25 IU/L (ref 0–32)
AST: 23 IU/L (ref 0–40)
Albumin/Globulin Ratio: 1.7 (ref 1.2–2.2)
Albumin: 4.3 g/dL (ref 3.9–4.9)
Alkaline Phosphatase: 81 IU/L (ref 44–121)
BUN/Creatinine Ratio: 11 — ABNORMAL LOW (ref 12–28)
BUN: 10 mg/dL (ref 8–27)
Bilirubin Total: 0.5 mg/dL (ref 0.0–1.2)
CO2: 23 mmol/L (ref 20–29)
Calcium: 9.3 mg/dL (ref 8.7–10.3)
Chloride: 103 mmol/L (ref 96–106)
Creatinine, Ser: 0.88 mg/dL (ref 0.57–1.00)
Globulin, Total: 2.5 g/dL (ref 1.5–4.5)
Glucose: 110 mg/dL — ABNORMAL HIGH (ref 70–99)
Potassium: 4.4 mmol/L (ref 3.5–5.2)
Sodium: 142 mmol/L (ref 134–144)
Total Protein: 6.8 g/dL (ref 6.0–8.5)
eGFR: 74 mL/min/{1.73_m2} (ref >59)

## 2022-09-11 LAB — HEMOGLOBIN A1C
Est. average glucose Bld gHb Est-mCnc: 137 mg/dL
Hgb A1c MFr Bld: 6.4 % — ABNORMAL HIGH (ref 4.8–5.6)

## 2022-09-11 LAB — POCT CBG (FASTING - GLUCOSE)-MANUAL ENTRY: Glucose Fasting, POC: 126 mg/dL — AB (ref 70–99)

## 2022-09-11 LAB — VITAMIN B12: Vitamin B-12: 342 pg/mL (ref 232–1245)

## 2022-09-11 NOTE — Progress Notes (Signed)
Established Patient Office Visit  Subjective:  Patient ID: Sandra Le, female    DOB: January 11, 1961  Age: 62 y.o. MRN: [id number]  Chief Complaint  Patient presents with   Follow-up    6 week follow up    Patient comes in for her follow-up and to discuss her lab results.  Her hemoglobin A1c has improved to 6.4 from 7.8 previously.  Currently patient is on Trulicity 3 mg/week.  She has not lost any significant amount of weight but her diabetes control has improved remarkably. Patient feels good today and considers that her mental health is under much better control with the help of her psychiatrist. She is starting to improve her dietary control as well as increase her physical activity. Patient reports of her rash specially under her breasts and requested dermatology consult.  Patient is also due for a bone density. She has a history of mitral valve regurgitation, the murmur is a little louder today.  Last echocardiogram was in 2021.  Will schedule another one.    No other concerns at this time.   Past Medical History:  Diagnosis Date   Hyperlipidemia    Hypertension     Past Surgical History:  Procedure Laterality Date   BARIATRIC SURGERY     CESAREAN SECTION      Social History   Socioeconomic History   Marital status: Married    Spouse name: Not on file   Number of children: Not on file   Years of education: Not on file   Highest education level: Not on file  Occupational History   Not on file  Tobacco Use   Smoking status: Former   Smokeless tobacco: Never  Vaping Use   Vaping Use: Never used  Substance and Sexual Activity   Alcohol use: Yes   Drug use: Not on file   Sexual activity: Not on file  Other Topics Concern   Not on file  Social History Narrative   Not on file   Social Determinants of Health   Financial Resource Strain: Not on file  Food Insecurity: Not on file  Transportation Needs: Not on file  Physical Activity: Not on file  Stress:  Not on file  Social Connections: Not on file  Intimate Partner Violence: Not on file    Family History  Problem Relation Age of Onset   Breast cancer Neg Hx    Bladder Cancer Neg Hx    Kidney cancer Neg Hx     No Known Allergies  Review of Systems  Constitutional: Negative.  Negative for chills, diaphoresis, fever, malaise/fatigue and weight loss.  HENT: Negative.    Eyes: Negative.   Respiratory: Negative.    Cardiovascular:  Negative for chest pain, palpitations and leg swelling.  Gastrointestinal: Negative.   Genitourinary: Negative.   Neurological: Negative.   Psychiatric/Behavioral:  Negative for depression. The patient is not nervous/anxious and does not have insomnia.        Objective:   BP 118/70   Pulse 76   Ht 5\' 5"  (1.651 m)   Wt 222 lb (100.7 kg)   SpO2 96%   BMI 36.94 kg/m   Vitals:   09/11/22 1025  BP: 118/70  Pulse: 76  Height: 5\' 5"  (1.651 m)  Weight: 222 lb (100.7 kg)  SpO2: 96%  BMI (Calculated): 36.94    Physical Exam Vitals and nursing note reviewed.  Constitutional:      Appearance: She is obese.  HENT:     Head:  Normocephalic and atraumatic.  Cardiovascular:     Rate and Rhythm: Normal rate.     Pulses: Normal pulses.     Heart sounds: Murmur heard.  Pulmonary:     Effort: Pulmonary effort is normal.     Breath sounds: Normal breath sounds.  Abdominal:     General: Bowel sounds are normal.     Palpations: Abdomen is soft.  Musculoskeletal:        General: Normal range of motion.     Cervical back: Normal range of motion and neck supple.  Skin:    General: Skin is warm.  Neurological:     General: No focal deficit present.     Mental Status: She is alert and oriented to person, place, and time.  Psychiatric:        Mood and Affect: Mood normal.        Behavior: Behavior normal.      Results for orders placed or performed in visit on 09/11/22  POCT CBG (Fasting - Glucose)  Result Value Ref Range   Glucose Fasting,  POC 126 (A) 70 - 99 mg/dL    Recent Results (from the past 2160 hour(s))  POCT CBG (Fasting - Glucose)     Status: Abnormal   Collection Time: 07/24/22 10:25 AM  Result Value Ref Range   Glucose Fasting, POC 118 (A) 70 - 99 mg/dL  CBC With Differential     Status: None   Collection Time: 09/10/22  1:05 PM  Result Value Ref Range   WBC 5.1 3.4 - 10.8 x10E3/uL   RBC 4.57 3.77 - 5.28 x10E6/uL   Hemoglobin 13.9 11.1 - 15.9 g/dL   Hematocrit 16.1 09.6 - 46.6 %   MCV 89 79 - 97 fL   MCH 30.4 26.6 - 33.0 pg   MCHC 34.2 31.5 - 35.7 g/dL   RDW 04.5 40.9 - 81.1 %   Neutrophils 61 Not Estab. %   Lymphs 24 Not Estab. %   Monocytes 14 Not Estab. %   Eos 1 Not Estab. %   Basos 0 Not Estab. %   Neutrophils Absolute 3.1 1.4 - 7.0 x10E3/uL   Lymphocytes Absolute 1.2 0.7 - 3.1 x10E3/uL   Monocytes Absolute 0.7 0.1 - 0.9 x10E3/uL   EOS (ABSOLUTE) 0.1 0.0 - 0.4 x10E3/uL   Basophils Absolute 0.0 0.0 - 0.2 x10E3/uL   Immature Granulocytes 0 Not Estab. %   Immature Grans (Abs) 0.0 0.0 - 0.1 x10E3/uL  CMP14+EGFR     Status: Abnormal   Collection Time: 09/10/22  1:05 PM  Result Value Ref Range   Glucose 110 (H) 70 - 99 mg/dL   BUN 10 8 - 27 mg/dL   Creatinine, Ser 9.14 0.57 - 1.00 mg/dL   eGFR 74 >78 GN/FAO/1.30   BUN/Creatinine Ratio 11 (L) 12 - 28   Sodium 142 134 - 144 mmol/L   Potassium 4.4 3.5 - 5.2 mmol/L   Chloride 103 96 - 106 mmol/L   CO2 23 20 - 29 mmol/L   Calcium 9.3 8.7 - 10.3 mg/dL   Total Protein 6.8 6.0 - 8.5 g/dL   Albumin 4.3 3.9 - 4.9 g/dL   Globulin, Total 2.5 1.5 - 4.5 g/dL   Albumin/Globulin Ratio 1.7 1.2 - 2.2   Bilirubin Total 0.5 0.0 - 1.2 mg/dL   Alkaline Phosphatase 81 44 - 121 IU/L   AST 23 0 - 40 IU/L   ALT 25 0 - 32 IU/L  Hemoglobin A1c  Status: Abnormal   Collection Time: 09/10/22  1:05 PM  Result Value Ref Range   Hgb A1c MFr Bld 6.4 (H) 4.8 - 5.6 %    Comment:          Prediabetes: 5.7 - 6.4          Diabetes: >6.4          Glycemic control for  adults with diabetes: <7.0    Est. average glucose Bld gHb Est-mCnc 137 mg/dL  Lipid panel     Status: None   Collection Time: 09/10/22  1:05 PM  Result Value Ref Range   Cholesterol, Total 170 100 - 199 mg/dL   Triglycerides 425 0 - 149 mg/dL   HDL 73 >95 mg/dL   VLDL Cholesterol Cal 22 5 - 40 mg/dL   LDL Chol Calc (NIH) 75 0 - 99 mg/dL   Chol/HDL Ratio 2.3 0.0 - 4.4 ratio    Comment:                                   T. Chol/HDL Ratio                                             Men  Women                               1/2 Avg.Risk  3.4    3.3                                   Avg.Risk  5.0    4.4                                2X Avg.Risk  9.6    7.1                                3X Avg.Risk 23.4   11.0   Vitamin B12     Status: None   Collection Time: 09/10/22  1:05 PM  Result Value Ref Range   Vitamin B-12 342 232 - 1,245 pg/mL  POCT CBG (Fasting - Glucose)     Status: Abnormal   Collection Time: 09/11/22 10:27 AM  Result Value Ref Range   Glucose Fasting, POC 126 (A) 70 - 99 mg/dL      Assessment & Plan:  Patient to continue all her medications as such.  She will take her Trulicity injections but also increase her diet control and increase her physical activity. Problem List Items Addressed This Visit     Depression   Relevant Medications   sertraline (ZOLOFT) 100 MG tablet   hydrOXYzine (ATARAX) 50 MG tablet   GERD (gastroesophageal reflux disease)   Essential hypertension, benign   Hyperlipemia   T2DM (type 2 diabetes mellitus) - Primary   Relevant Orders   POCT CBG (Fasting - Glucose) (Completed)   OSA (obstructive sleep apnea)   Rash   Relevant Orders   Ambulatory referral to Dermatology   Nonrheumatic mitral valve regurgitation   Relevant Orders   PCV ECHOCARDIOGRAM  COMPLETE   Other Visit Diagnoses     Screening for osteoporosis       Relevant Orders   DG Bone Density       Return in about 3 months (around 12/11/2022).   Total time spent: 30  minutes  Margaretann Loveless, MD  09/11/2022

## 2022-10-01 ENCOUNTER — Other Ambulatory Visit: Payer: 59

## 2022-10-02 ENCOUNTER — Ambulatory Visit (INDEPENDENT_AMBULATORY_CARE_PROVIDER_SITE_OTHER): Payer: 59 | Admitting: Internal Medicine

## 2022-10-02 ENCOUNTER — Encounter: Payer: Self-pay | Admitting: Internal Medicine

## 2022-10-02 VITALS — BP 128/62 | HR 88 | Ht 65.0 in | Wt 215.6 lb

## 2022-10-02 DIAGNOSIS — E1165 Type 2 diabetes mellitus with hyperglycemia: Secondary | ICD-10-CM

## 2022-10-02 DIAGNOSIS — J3089 Other allergic rhinitis: Secondary | ICD-10-CM

## 2022-10-02 DIAGNOSIS — I10 Essential (primary) hypertension: Secondary | ICD-10-CM

## 2022-10-02 DIAGNOSIS — J329 Chronic sinusitis, unspecified: Secondary | ICD-10-CM | POA: Diagnosis not present

## 2022-10-02 LAB — POCT CBG (FASTING - GLUCOSE)-MANUAL ENTRY: Glucose Fasting, POC: 138 mg/dL — AB (ref 70–99)

## 2022-10-02 MED ORDER — AMOXICILLIN-POT CLAVULANATE 875-125 MG PO TABS: 1.0000 | ORAL_TABLET | Freq: Two times a day (BID) | ORAL | 0 refills | Status: DC

## 2022-10-02 NOTE — Progress Notes (Signed)
Established Patient Office Visit  Subjective:  Patient ID: Sandra Le, female    DOB: 19-Mar-1961  Age: 62 y.o. MRN: [id number]  Chief Complaint  Patient presents with   Follow-up    Sore throat    Patient comes in with complaints of sore throat which started last week.  Then it progressed to sinus congestion, postnasal drip, and now she has yellow-green nasal discharge and sputum.  Her chest is clear and there is no chest congestion she does have some headache.  Patient has chronic nasal and sinus issues and is under care of of ENT.  She has been advised to continue using her rinses and Flonase nasal spray.  Will send in a prescription for Augmentin.    No other concerns at this time.   Past Medical History:  Diagnosis Date   Hyperlipidemia    Hypertension     Past Surgical History:  Procedure Laterality Date   BARIATRIC SURGERY     CESAREAN SECTION      Social History   Socioeconomic History   Marital status: Married    Spouse name: Not on file   Number of children: Not on file   Years of education: Not on file   Highest education level: Not on file  Occupational History   Not on file  Tobacco Use   Smoking status: Former   Smokeless tobacco: Never  Vaping Use   Vaping Use: Never used  Substance and Sexual Activity   Alcohol use: Yes   Drug use: Not on file   Sexual activity: Not on file  Other Topics Concern   Not on file  Social History Narrative   Not on file   Social Determinants of Health   Financial Resource Strain: Not on file  Food Insecurity: Not on file  Transportation Needs: Not on file  Physical Activity: Not on file  Stress: Not on file  Social Connections: Not on file  Intimate Partner Violence: Not on file    Family History  Problem Relation Age of Onset   Breast cancer Neg Hx    Bladder Cancer Neg Hx    Kidney cancer Neg Hx     No Known Allergies  Review of Systems  Constitutional:  Positive for chills and  malaise/fatigue. Negative for fever and weight loss.  HENT:  Positive for congestion, sinus pain and sore throat. Negative for ear discharge, ear pain, hearing loss, nosebleeds and tinnitus.   Eyes: Negative.   Respiratory:  Negative for cough, sputum production, shortness of breath and wheezing.   Cardiovascular:  Negative for chest pain and palpitations.  Gastrointestinal:  Negative for abdominal pain, blood in stool, constipation, heartburn, nausea and vomiting.  Genitourinary: Negative.   Musculoskeletal:  Negative for back pain, joint pain and myalgias.  Skin: Negative.   Neurological: Negative.   Endo/Heme/Allergies: Negative.   Psychiatric/Behavioral:  Positive for depression. Negative for hallucinations, substance abuse and suicidal ideas. The patient is nervous/anxious.        Objective:   BP 128/62   Pulse 88   Ht 5\' 5"  (1.651 m)   Wt 215 lb 9.6 oz (97.8 kg)   SpO2 94%   BMI 35.88 kg/m   Vitals:   10/02/22 1100  BP: 128/62  Pulse: 88  Height: 5\' 5"  (1.651 m)  Weight: 215 lb 9.6 oz (97.8 kg)  SpO2: 94%  BMI (Calculated): 35.88    Physical Exam Vitals and nursing note reviewed.  Constitutional:  Appearance: Normal appearance.  HENT:     Head: Normocephalic.     Right Ear: Tympanic membrane and ear canal normal. There is no impacted cerumen.     Left Ear: Tympanic membrane and ear canal normal. There is no impacted cerumen.     Nose: Congestion present. No rhinorrhea.     Mouth/Throat:     Mouth: Mucous membranes are moist.     Pharynx: Oropharynx is clear. No oropharyngeal exudate or posterior oropharyngeal erythema.  Eyes:     Conjunctiva/sclera: Conjunctivae normal.  Cardiovascular:     Rate and Rhythm: Normal rate and regular rhythm.     Pulses: Normal pulses.     Heart sounds: Normal heart sounds.  Pulmonary:     Effort: Pulmonary effort is normal. No respiratory distress.     Breath sounds: Normal breath sounds. No wheezing, rhonchi or rales.   Abdominal:     General: Bowel sounds are normal.     Palpations: Abdomen is soft.  Musculoskeletal:        General: Normal range of motion.     Cervical back: Normal range of motion and neck supple.  Lymphadenopathy:     Cervical: No cervical adenopathy.  Skin:    General: Skin is warm and dry.  Neurological:     General: No focal deficit present.     Mental Status: She is alert and oriented to person, place, and time.  Psychiatric:        Mood and Affect: Mood normal.        Behavior: Behavior normal.      Results for orders placed or performed in visit on 10/02/22  POCT CBG (Fasting - Glucose)  Result Value Ref Range   Glucose Fasting, POC 138 (A) 70 - 99 mg/dL    Recent Results (from the past 2160 hour(s))  POCT CBG (Fasting - Glucose)     Status: Abnormal   Collection Time: 07/24/22 10:25 AM  Result Value Ref Range   Glucose Fasting, POC 118 (A) 70 - 99 mg/dL  CBC With Differential     Status: None   Collection Time: 09/10/22  1:05 PM  Result Value Ref Range   WBC 5.1 3.4 - 10.8 x10E3/uL   RBC 4.57 3.77 - 5.28 x10E6/uL   Hemoglobin 13.9 11.1 - 15.9 g/dL   Hematocrit 40.9 81.1 - 46.6 %   MCV 89 79 - 97 fL   MCH 30.4 26.6 - 33.0 pg   MCHC 34.2 31.5 - 35.7 g/dL   RDW 91.4 78.2 - 95.6 %   Neutrophils 61 Not Estab. %   Lymphs 24 Not Estab. %   Monocytes 14 Not Estab. %   Eos 1 Not Estab. %   Basos 0 Not Estab. %   Neutrophils Absolute 3.1 1.4 - 7.0 x10E3/uL   Lymphocytes Absolute 1.2 0.7 - 3.1 x10E3/uL   Monocytes Absolute 0.7 0.1 - 0.9 x10E3/uL   EOS (ABSOLUTE) 0.1 0.0 - 0.4 x10E3/uL   Basophils Absolute 0.0 0.0 - 0.2 x10E3/uL   Immature Granulocytes 0 Not Estab. %   Immature Grans (Abs) 0.0 0.0 - 0.1 x10E3/uL  CMP14+EGFR     Status: Abnormal   Collection Time: 09/10/22  1:05 PM  Result Value Ref Range   Glucose 110 (H) 70 - 99 mg/dL   BUN 10 8 - 27 mg/dL   Creatinine, Ser 2.13 0.57 - 1.00 mg/dL   eGFR 74 >08 MV/HQI/6.96   BUN/Creatinine Ratio 11 (L) 12  - 28  Sodium 142 134 - 144 mmol/L   Potassium 4.4 3.5 - 5.2 mmol/L   Chloride 103 96 - 106 mmol/L   CO2 23 20 - 29 mmol/L   Calcium 9.3 8.7 - 10.3 mg/dL   Total Protein 6.8 6.0 - 8.5 g/dL   Albumin 4.3 3.9 - 4.9 g/dL   Globulin, Total 2.5 1.5 - 4.5 g/dL   Albumin/Globulin Ratio 1.7 1.2 - 2.2   Bilirubin Total 0.5 0.0 - 1.2 mg/dL   Alkaline Phosphatase 81 44 - 121 IU/L   AST 23 0 - 40 IU/L   ALT 25 0 - 32 IU/L  Hemoglobin A1c     Status: Abnormal   Collection Time: 09/10/22  1:05 PM  Result Value Ref Range   Hgb A1c MFr Bld 6.4 (H) 4.8 - 5.6 %    Comment:          Prediabetes: 5.7 - 6.4          Diabetes: >6.4          Glycemic control for adults with diabetes: <7.0    Est. average glucose Bld gHb Est-mCnc 137 mg/dL  Lipid panel     Status: None   Collection Time: 09/10/22  1:05 PM  Result Value Ref Range   Cholesterol, Total 170 100 - 199 mg/dL   Triglycerides 161 0 - 149 mg/dL   HDL 73 >09 mg/dL   VLDL Cholesterol Cal 22 5 - 40 mg/dL   LDL Chol Calc (NIH) 75 0 - 99 mg/dL   Chol/HDL Ratio 2.3 0.0 - 4.4 ratio    Comment:                                   T. Chol/HDL Ratio                                             Men  Women                               1/2 Avg.Risk  3.4    3.3                                   Avg.Risk  5.0    4.4                                2X Avg.Risk  9.6    7.1                                3X Avg.Risk 23.4   11.0   Vitamin B12     Status: None   Collection Time: 09/10/22  1:05 PM  Result Value Ref Range   Vitamin B-12 342 232 - 1,245 pg/mL  POCT CBG (Fasting - Glucose)     Status: Abnormal   Collection Time: 09/11/22 10:27 AM  Result Value Ref Range   Glucose Fasting, POC 126 (A) 70 - 99 mg/dL  POCT CBG (Fasting - Glucose)     Status: Abnormal   Collection Time: 10/02/22 11:07 AM  Result Value Ref Range   Glucose Fasting, POC 138 (A) 70 - 99 mg/dL      Assessment & Plan:  Prescription sent for Augmentin.  Patient will continue her  nasal rinses as well as Flonase nasal spray. Problem List Items Addressed This Visit     Essential hypertension, benign   T2DM (type 2 diabetes mellitus) ([facility])   Relevant Orders   POCT CBG (Fasting - Glucose) (Completed)   Non-seasonal allergic rhinitis   Recurrent sinus infections - Primary   Relevant Medications   amoxicillin-clavulanate (AUGMENTIN) 875-125 MG tablet    Follow up as scheduled.  Total time spent: 25 minutes  Margaretann Loveless, MD  10/02/2022

## 2022-10-09 DIAGNOSIS — F5105 Insomnia due to other mental disorder: Secondary | ICD-10-CM | POA: Diagnosis not present

## 2022-10-09 DIAGNOSIS — F411 Generalized anxiety disorder: Secondary | ICD-10-CM | POA: Diagnosis not present

## 2022-10-09 DIAGNOSIS — F4312 Post-traumatic stress disorder, chronic: Secondary | ICD-10-CM | POA: Diagnosis not present

## 2022-10-09 DIAGNOSIS — F39 Unspecified mood [affective] disorder: Secondary | ICD-10-CM | POA: Diagnosis not present

## 2022-10-11 ENCOUNTER — Ambulatory Visit (INDEPENDENT_AMBULATORY_CARE_PROVIDER_SITE_OTHER): Payer: Self-pay

## 2022-10-11 DIAGNOSIS — I361 Nonrheumatic tricuspid (valve) insufficiency: Secondary | ICD-10-CM | POA: Diagnosis not present

## 2022-10-11 DIAGNOSIS — I340 Nonrheumatic mitral (valve) insufficiency: Secondary | ICD-10-CM

## 2022-10-11 DIAGNOSIS — I351 Nonrheumatic aortic (valve) insufficiency: Secondary | ICD-10-CM | POA: Diagnosis not present

## 2022-10-17 ENCOUNTER — Telehealth: Payer: Self-pay

## 2022-10-17 DIAGNOSIS — F331 Major depressive disorder, recurrent, moderate: Secondary | ICD-10-CM | POA: Diagnosis not present

## 2022-10-17 NOTE — Telephone Encounter (Signed)
Pt called and left vm regarding rx Trulicity being on back order, said she has tried to get rx at other pharmacies but unable to get rx. She asked if there's an alternative rx you can get in for her? Please advise

## 2022-10-18 ENCOUNTER — Ambulatory Visit: Payer: 59 | Admitting: Cardiovascular Disease

## 2022-10-18 ENCOUNTER — Encounter: Payer: Self-pay | Admitting: Cardiovascular Disease

## 2022-10-18 VITALS — BP 112/76 | HR 80 | Ht 65.0 in | Wt 224.0 lb

## 2022-10-18 DIAGNOSIS — G4733 Obstructive sleep apnea (adult) (pediatric): Secondary | ICD-10-CM | POA: Diagnosis not present

## 2022-10-18 DIAGNOSIS — I10 Essential (primary) hypertension: Secondary | ICD-10-CM | POA: Diagnosis not present

## 2022-10-18 DIAGNOSIS — I340 Nonrheumatic mitral (valve) insufficiency: Secondary | ICD-10-CM

## 2022-10-18 DIAGNOSIS — E782 Mixed hyperlipidemia: Secondary | ICD-10-CM

## 2022-10-18 MED ORDER — METOPROLOL SUCCINATE ER 25 MG PO TB24: 25.0000 mg | ORAL_TABLET | Freq: Every day | ORAL | 2 refills | Status: DC

## 2022-10-18 MED ORDER — METOPROLOL SUCCINATE 12.5 MG HALF TABLET: 25.0000 mg | ORAL_TABLET | Freq: Every day | ORAL | Status: DC

## 2022-10-18 NOTE — Assessment & Plan Note (Signed)
Trace mitral regurgitation

## 2022-10-18 NOTE — Progress Notes (Signed)
Cardiology Office Note   Date:  10/18/2022   ID:  Sandra Le, DOB 1961/02/28, MRN [id number]  PCP:  Margaretann Loveless, MD  Cardiologist:  Adrian Blackwater, MD      History of Present Illness: Sandra Le is a 62 y.o. female who presents for  Chief Complaint  Patient presents with   Follow-up    Echo results    Patient came for evaluation because of having echocardiogram done and to get results.  Patient denies any chest pain or shortness of breath discussed with the patient that the left ventricular ejection fraction was normal and patient had trace mitral and tricuspid regurgitation.  Also had grade 1 diastolic dysfunction.      Past Medical History:  Diagnosis Date   Hyperlipidemia    Hypertension      Past Surgical History:  Procedure Laterality Date   BARIATRIC SURGERY     CESAREAN SECTION       Current Outpatient Medications  Medication Sig Dispense Refill   acetaminophen (TYLENOL) 500 MG tablet Take by mouth.     amoxicillin-clavulanate (AUGMENTIN) 875-125 MG tablet Take 1 tablet by mouth 2 (two) times Le. 20 tablet 0   Azelastine HCl 0.15 % SOLN Place into the nose.     calcium-vitamin D (OSCAL WITH D) 500-200 MG-UNIT TABS tablet Take by mouth.     cloNIDine (CATAPRES) 0.3 MG tablet Take 1 tablet (0.3 mg total) by mouth at bedtime. 30 tablet 3   DULoxetine (CYMBALTA) 60 MG capsule   5   hydrOXYzine (ATARAX) 50 MG tablet Take 50 mg by mouth every 8 (eight) hours as needed for anxiety.     ketoconazole (NIZORAL) 2 % cream   1   lisinopril (PRINIVIL,ZESTRIL) 5 MG tablet   2   Lurasidone HCl (LATUDA) 60 MG TABS Take 60 mg by mouth at bedtime.     metFORMIN (GLUCOPHAGE) 1000 MG tablet   2   metoprolol succinate (TOPROL XL) 25 MG 24 hr tablet Take 1 tablet (25 mg total) by mouth Le. 30 tablet 2   Multiple Vitamin (MULTIVITAMIN) capsule Take by mouth.     pantoprazole (PROTONIX) 40 MG tablet Take 1 tablet (40 mg total) by mouth Le. 90 tablet 3    sertraline (ZOLOFT) 100 MG tablet Take 100 mg by mouth Le.     simvastatin (ZOCOR) 20 MG tablet Take 1 tablet (20 mg total) by mouth Le. 90 tablet 3   Dulaglutide (TRULICITY) 3 MG/0.5ML SOPN INJECT 3MG  AS DIRECTED ONCE A WEEK (Patient not taking: Reported on 10/18/2022) 3 mL 3   metoprolol succinate (TOPROL XL) 25 MG 24 hr tablet Take 1 tablet (25 mg total) by mouth Le. 30 tablet 2   No current facility-administered medications for this visit.    Allergies:   Patient has no known allergies.    Social History:   reports that she has quit smoking. She has never used smokeless tobacco. She reports current alcohol use. No history on file for drug use.   Family History:  family history is not on file.    ROS:     Review of Systems  Constitutional: Negative.   HENT: Negative.    Eyes: Negative.   Respiratory: Negative.    Gastrointestinal: Negative.   Genitourinary: Negative.   Musculoskeletal: Negative.   Skin: Negative.   Neurological: Negative.   Endo/Heme/Allergies: Negative.   Psychiatric/Behavioral: Negative.    All other systems reviewed and are negative.  All other systems are reviewed and negative.    PHYSICAL EXAM: VS:  BP 112/76   Pulse 80   Ht 5\' 5"  (1.651 m)   Wt 224 lb (101.6 kg)   SpO2 95%   BMI 37.28 kg/m  , BMI Body mass index is 37.28 kg/m. Last weight:  Wt Readings from Last 3 Encounters:  10/18/22 224 lb (101.6 kg)  10/02/22 215 lb 9.6 oz (97.8 kg)  09/11/22 222 lb (100.7 kg)     Physical Exam Constitutional:      Appearance: Normal appearance.  Cardiovascular:     Rate and Rhythm: Normal rate and regular rhythm.     Heart sounds: Normal heart sounds.  Pulmonary:     Effort: Pulmonary effort is normal.     Breath sounds: Normal breath sounds.  Musculoskeletal:     Right lower leg: No edema.     Left lower leg: No edema.  Neurological:     Mental Status: She is alert.       EKG:   Recent Labs: 09/10/2022: ALT 25; BUN 10;  Creatinine, Ser 0.88; Hemoglobin 13.9; Potassium 4.4; Sodium 142    Lipid Panel    Component Value Date/Time   CHOL 170 09/10/2022 1305   TRIG 131 09/10/2022 1305   HDL 73 09/10/2022 1305   CHOLHDL 2.3 09/10/2022 1305   LDLCALC 75 09/10/2022 1305      Other studies Reviewed: Additional studies/ records that were reviewed today include:  Review of the above records demonstrates:       No data to display            ASSESSMENT AND PLAN:    ICD-10-CM   1. Essential hypertension, benign  I10 DISCONTINUED: metoprolol succinate (TOPROL-XL) 24 hr tablet 25 mg    2. Nonrheumatic mitral valve regurgitation  I34.0 DISCONTINUED: metoprolol succinate (TOPROL-XL) 24 hr tablet 25 mg    3. OSA (obstructive sleep apnea)  G47.33 DISCONTINUED: metoprolol succinate (TOPROL-XL) 24 hr tablet 25 mg    4. Mixed hyperlipidemia  E78.2 DISCONTINUED: metoprolol succinate (TOPROL-XL) 24 hr tablet 25 mg    5. Morbid obesity (HCC)  E66.01 DISCONTINUED: metoprolol succinate (TOPROL-XL) 24 hr tablet 25 mg    6. Primary hypertension  I10    Heart rate appears to be fast on examination.  Thus we will start the patient on metoprolol for extended 25 mg once a day.  Also get follow-up       Problem List Items Addressed This Visit       Cardiovascular and Mediastinum   Essential hypertension, benign - Primary   Relevant Medications   metoprolol succinate (TOPROL XL) 25 MG 24 hr tablet   metoprolol succinate (TOPROL XL) 25 MG 24 hr tablet   Nonrheumatic mitral valve regurgitation    Trace mitral regurgitation      Relevant Medications   metoprolol succinate (TOPROL XL) 25 MG 24 hr tablet   metoprolol succinate (TOPROL XL) 25 MG 24 hr tablet     Respiratory   OSA (obstructive sleep apnea)     Other   Hyperlipemia   Relevant Medications   metoprolol succinate (TOPROL XL) 25 MG 24 hr tablet   metoprolol succinate (TOPROL XL) 25 MG 24 hr tablet   Morbid obesity (HCC)   Other Visit  Diagnoses     Primary hypertension       Heart rate appears to be fast on examination.  Thus we will start the patient on metoprolol for extended 25  mg once a day.  Also get follow-up   Relevant Medications   metoprolol succinate (TOPROL XL) 25 MG 24 hr tablet   metoprolol succinate (TOPROL XL) 25 MG 24 hr tablet          Disposition:   Return in about 2 weeks (around 11/01/2022).    Total time spent: 35 minutes  Signed,  Adrian Blackwater, MD  10/18/2022 2:38 PM    [facility]

## 2022-10-24 ENCOUNTER — Other Ambulatory Visit: Payer: Self-pay

## 2022-10-24 DIAGNOSIS — J329 Chronic sinusitis, unspecified: Secondary | ICD-10-CM

## 2022-10-24 MED ORDER — LIRAGLUTIDE 18 MG/3ML ~~LOC~~ SOPN
PEN_INJECTOR | SUBCUTANEOUS | 3 refills | Status: DC
  Filled 2022-10-24: qty 3, fill #0

## 2022-10-24 MED ORDER — AMOXICILLIN-POT CLAVULANATE 875-125 MG PO TABS: 1.0000 | ORAL_TABLET | Freq: Two times a day (BID) | ORAL | 0 refills | Status: DC

## 2022-10-24 MED ORDER — LIRAGLUTIDE 18 MG/3ML ~~LOC~~ SOPN: PEN_INJECTOR | SUBCUTANEOUS | 3 refills | Status: DC

## 2022-10-25 ENCOUNTER — Other Ambulatory Visit: Payer: Self-pay

## 2022-10-25 MED ORDER — LIRAGLUTIDE 18 MG/3ML ~~LOC~~ SOPN
PEN_INJECTOR | SUBCUTANEOUS | 3 refills | Status: DC
  Filled 2022-10-25: qty 9, 30d supply, fill #0

## 2022-11-01 ENCOUNTER — Ambulatory Visit: Payer: 59 | Admitting: Cardiovascular Disease

## 2022-11-01 ENCOUNTER — Encounter: Payer: Self-pay | Admitting: Cardiovascular Disease

## 2022-11-01 VITALS — BP 110/64 | HR 61 | Ht 65.0 in | Wt 224.0 lb

## 2022-11-01 DIAGNOSIS — G4733 Obstructive sleep apnea (adult) (pediatric): Secondary | ICD-10-CM

## 2022-11-01 DIAGNOSIS — I340 Nonrheumatic mitral (valve) insufficiency: Secondary | ICD-10-CM | POA: Diagnosis not present

## 2022-11-01 DIAGNOSIS — I10 Essential (primary) hypertension: Secondary | ICD-10-CM

## 2022-11-01 DIAGNOSIS — E782 Mixed hyperlipidemia: Secondary | ICD-10-CM | POA: Diagnosis not present

## 2022-11-01 NOTE — Progress Notes (Signed)
Cardiology Office Note   Date:  11/01/2022   ID:  Sandra Le, DOB [birth year], MRN [id number]  PCP:  Sandra Loveless, MD  Cardiologist:  Sandra Blackwater, MD      History of Present Illness: Sandra Le is a 62 y.o. female who presents for  Chief Complaint  Patient presents with   Follow-up    2 weeks follow up    Feeling lethargic after taking metoprolol 25 daily      Past Medical History:  Diagnosis Date   Hyperlipidemia    Hypertension      Past Surgical History:  Procedure Laterality Date   BARIATRIC SURGERY     CESAREAN SECTION       Current Outpatient Medications  Medication Sig Dispense Refill   acetaminophen (TYLENOL) 500 MG tablet Take by mouth.     amoxicillin-clavulanate (AUGMENTIN) 875-125 MG tablet Take 1 tablet by mouth 2 (two) times daily. 20 tablet 0   Azelastine HCl 0.15 % SOLN Place into the nose.     calcium-vitamin D (OSCAL WITH D) 500-200 MG-UNIT TABS tablet Take by mouth.     cloNIDine (CATAPRES) 0.3 MG tablet Take 1 tablet (0.3 mg total) by mouth at bedtime. 30 tablet 3   Dulaglutide (TRULICITY) 3 MG/0.5ML SOPN INJECT 3MG  AS DIRECTED ONCE A WEEK (Patient not taking: Reported on 10/18/2022) 3 mL 3   DULoxetine (CYMBALTA) 60 MG capsule   5   hydrOXYzine (ATARAX) 50 MG tablet Take 50 mg by mouth every 8 (eight) hours as needed for anxiety.     ketoconazole (NIZORAL) 2 % cream   1   liraglutide (VICTOZA) 18 MG/3ML SOPN Inject 1.2mg  into the skin once daily for one week, then inject 1.8mg  into the skin once daily 9 mL 3   lisinopril (PRINIVIL,ZESTRIL) 5 MG tablet   2   Lurasidone HCl (LATUDA) 60 MG TABS Take 60 mg by mouth at bedtime.     metFORMIN (GLUCOPHAGE) 1000 MG tablet   2   metoprolol succinate (TOPROL XL) 25 MG 24 hr tablet Take 1 tablet (25 mg total) by mouth daily. 30 tablet 2   Multiple Vitamin (MULTIVITAMIN) capsule Take by mouth.     pantoprazole (PROTONIX) 40 MG tablet Take 1 tablet (40 mg total) by mouth daily. 90 tablet  3   sertraline (ZOLOFT) 100 MG tablet Take 100 mg by mouth daily.     simvastatin (ZOCOR) 20 MG tablet Take 1 tablet (20 mg total) by mouth daily. 90 tablet 3   No current facility-administered medications for this visit.    Allergies:   Patient has no known allergies.    Social History:   reports that she has quit smoking. She has never used smokeless tobacco. She reports current alcohol use. No history on file for drug use.   Family History:  family history is not on file.    ROS:     Review of Systems  Constitutional: Negative.   HENT: Negative.    Eyes: Negative.   Respiratory: Negative.    Gastrointestinal: Negative.   Genitourinary: Negative.   Musculoskeletal: Negative.   Skin: Negative.   Neurological: Negative.   Endo/Heme/Allergies: Negative.   Psychiatric/Behavioral: Negative.    All other systems reviewed and are negative.     All other systems are reviewed and negative.    PHYSICAL EXAM: VS:  BP 110/64   Pulse 61   Ht 5\' 5"  (1.651 m)   SpO2 95%   BMI 37.28  kg/m  , BMI Body mass index is 37.28 kg/m. Last weight:  Wt Readings from Last 3 Encounters:  10/18/22 224 lb (101.6 kg)  10/02/22 215 lb 9.6 oz (97.8 kg)  09/11/22 222 lb (100.7 kg)     Physical Exam Constitutional:      Appearance: Normal appearance.  Cardiovascular:     Rate and Rhythm: Normal rate and regular rhythm.     Heart sounds: Normal heart sounds.  Pulmonary:     Effort: Pulmonary effort is normal.     Breath sounds: Normal breath sounds.  Musculoskeletal:     Right lower leg: No edema.     Left lower leg: No edema.  Neurological:     Mental Status: She is alert.       EKG:   Recent Labs: 09/10/2022: ALT 25; BUN 10; Creatinine, Ser 0.88; Hemoglobin 13.9; Potassium 4.4; Sodium 142    Lipid Panel    Component Value Date/Time   CHOL 170 09/10/2022 1305   TRIG 131 09/10/2022 1305   HDL 73 09/10/2022 1305   CHOLHDL 2.3 09/10/2022 1305   LDLCALC 75 09/10/2022 1305       Other studies Reviewed: Additional studies/ records that were reviewed today include:  Review of the above records demonstrates:       No data to display            ASSESSMENT AND PLAN:    ICD-10-CM   1. Essential hypertension, benign  I10    Advise tapering off metoprolol by 1/2 tab a week then 1/2 every other day and then stop    2. Nonrheumatic mitral valve regurgitation  I34.0    trace to mild    3. OSA (obstructive sleep apnea)  G47.33     4. Mixed hyperlipidemia  E78.2     5. Morbid obesity (HCC)  E66.01        Problem List Items Addressed This Visit       Cardiovascular and Mediastinum   Essential hypertension, benign - Primary   Nonrheumatic mitral valve regurgitation     Respiratory   OSA (obstructive sleep apnea)     Other   Hyperlipemia   Morbid obesity (HCC)       Disposition:   Return in about 6 months (around 05/03/2023).    Total time spent: 30 minutes  Signed,  Sandra Blackwater, MD  11/01/2022 11:12 AM    [facility]

## 2022-11-05 ENCOUNTER — Telehealth: Payer: Self-pay | Admitting: Internal Medicine

## 2022-11-05 NOTE — Telephone Encounter (Signed)
Patient left VM wanting something new called in that will assist with her weight loss and her A1C. Please advise.

## 2022-11-06 DIAGNOSIS — G4733 Obstructive sleep apnea (adult) (pediatric): Secondary | ICD-10-CM | POA: Diagnosis not present

## 2022-11-12 ENCOUNTER — Ambulatory Visit (INDEPENDENT_AMBULATORY_CARE_PROVIDER_SITE_OTHER): Payer: 59 | Admitting: Internal Medicine

## 2022-11-12 ENCOUNTER — Encounter: Payer: Self-pay | Admitting: Internal Medicine

## 2022-11-12 VITALS — BP 118/70 | HR 78 | Ht 65.0 in | Wt 228.0 lb

## 2022-11-12 DIAGNOSIS — E1165 Type 2 diabetes mellitus with hyperglycemia: Secondary | ICD-10-CM

## 2022-11-12 DIAGNOSIS — I10 Essential (primary) hypertension: Secondary | ICD-10-CM

## 2022-11-12 DIAGNOSIS — E782 Mixed hyperlipidemia: Secondary | ICD-10-CM

## 2022-11-12 DIAGNOSIS — N760 Acute vaginitis: Secondary | ICD-10-CM

## 2022-11-12 DIAGNOSIS — N952 Postmenopausal atrophic vaginitis: Secondary | ICD-10-CM

## 2022-11-12 HISTORY — DX: Acute vaginitis: N76.0

## 2022-11-12 LAB — POCT CBG (FASTING - GLUCOSE)-MANUAL ENTRY: Glucose Fasting, POC: 225 mg/dL — AB (ref 70–99)

## 2022-11-12 MED ORDER — RYBELSUS 7 MG PO TABS: 1.0000 | ORAL_TABLET | Freq: Every day | ORAL | 2 refills | Status: DC

## 2022-11-12 NOTE — Progress Notes (Signed)
Established Patient Office Visit  Subjective:  Patient ID: Sandra Le, female    DOB: 03/18/1961  Age: 62 y.o. MRN: [id number]  Chief Complaint  Patient presents with   Follow-up    Follow up    Patient in office for 2 month follow up. Patient reports not being able to fill Trulicty or Victoza due to Sport and exercise psychologist. Patient has taken and tolerated Rybelsus in the past, insurance did not cover. Has new insurance, will try Rybelsus again.  Patient complaining of vaginal dryness around the labia.  Will continue to work on diet and exercise.     No other concerns at this time.   Past Medical History:  Diagnosis Date   Hyperlipidemia    Hypertension     Past Surgical History:  Procedure Laterality Date   BARIATRIC SURGERY     CESAREAN SECTION      Social History   Socioeconomic History   Marital status: Married    Spouse name: Not on file   Number of children: Not on file   Years of education: Not on file   Highest education level: Not on file  Occupational History   Not on file  Tobacco Use   Smoking status: Former   Smokeless tobacco: Never  Vaping Use   Vaping Use: Never used  Substance and Sexual Activity   Alcohol use: Yes   Drug use: Not on file   Sexual activity: Not on file  Other Topics Concern   Not on file  Social History Narrative   Not on file   Social Determinants of Health   Financial Resource Strain: Not on file  Food Insecurity: Not on file  Transportation Needs: Not on file  Physical Activity: Not on file  Stress: Not on file  Social Connections: Not on file  Intimate Partner Violence: Not on file    Family History  Problem Relation Age of Onset   Breast cancer Neg Hx    Bladder Cancer Neg Hx    Kidney cancer Neg Hx     No Known Allergies  Review of Systems  Constitutional: Negative.  Negative for chills, fever, malaise/fatigue and weight loss.  HENT: Negative.    Eyes: Negative.   Respiratory: Negative.  Negative  for cough and shortness of breath.   Cardiovascular: Negative.  Negative for chest pain, palpitations and leg swelling.  Gastrointestinal: Negative.  Negative for abdominal pain, constipation, diarrhea, heartburn, nausea and vomiting.  Genitourinary: Negative.  Negative for dysuria, frequency and urgency.       Dryness around labia  Musculoskeletal:  Negative for joint pain, myalgias and neck pain.  Skin: Negative.   Neurological: Negative.  Negative for dizziness, tremors, sensory change, focal weakness and headaches.  Endo/Heme/Allergies: Negative.   Psychiatric/Behavioral: Negative.  Negative for depression.        Objective:   BP 118/70   Pulse 78   Ht 5\' 5"  (1.651 m)   Wt 228 lb (103.4 kg)   SpO2 97%   BMI 37.94 kg/m   Vitals:   11/12/22 1144  BP: 118/70  Pulse: 78  Height: 5\' 5"  (1.651 m)  Weight: 228 lb (103.4 kg)  SpO2: 97%  BMI (Calculated): 37.94    Physical Exam Vitals and nursing note reviewed.  Constitutional:      Appearance: Normal appearance.  HENT:     Head: Normocephalic and atraumatic.     Nose: Nose normal.  Cardiovascular:     Rate and Rhythm: Normal rate and  regular rhythm.     Pulses: Normal pulses.     Heart sounds: Normal heart sounds. No murmur heard. Pulmonary:     Effort: Pulmonary effort is normal.     Breath sounds: Normal breath sounds. No wheezing or rales.  Abdominal:     Palpations: Abdomen is soft.     Tenderness: There is no abdominal tenderness. There is no right CVA tenderness, left CVA tenderness or guarding.     Hernia: No hernia is present.  Musculoskeletal:        General: Normal range of motion.     Cervical back: Normal range of motion.  Skin:    General: Skin is warm and dry.  Neurological:     General: No focal deficit present.     Mental Status: She is alert and oriented to person, place, and time.  Psychiatric:        Mood and Affect: Mood normal.        Behavior: Behavior normal.     Results for orders  placed or performed in visit on 11/12/22  POCT CBG (Fasting - Glucose)  Result Value Ref Range   Glucose Fasting, POC 225 (A) 70 - 99 mg/dL        Assessment & Plan:  Will try Rybelsus again for diabetes management. Vaginal cream for dryness. Attempt to diet and exercise at home and while traveling.   [name] was seen today for follow-up.  Diagnoses and all orders for this visit:  Type 2 diabetes mellitus with hyperglycemia, without long-term current use of insulin (HCC) -     POCT CBG (Fasting - Glucose) -     Semaglutide (RYBELSUS) 7 MG TABS; Take 1 tablet (7 mg total) by mouth daily.  Atrophic vaginitis  Essential hypertension, benign  Mixed hyperlipidemia      Return in about 1 month (around 12/12/2022) for as scheduled.   Total time spent: 25  minutes   11/12/2022   This document may have been prepared by Dragon Voice Recognition software and as such may include unintentional dictation errors.

## 2022-11-13 DIAGNOSIS — F331 Major depressive disorder, recurrent, moderate: Secondary | ICD-10-CM | POA: Diagnosis not present

## 2022-11-15 ENCOUNTER — Encounter: Payer: Self-pay | Admitting: Internal Medicine

## 2022-11-27 ENCOUNTER — Other Ambulatory Visit: Payer: Self-pay | Admitting: Internal Medicine

## 2022-11-27 ENCOUNTER — Telehealth: Payer: Self-pay | Admitting: Internal Medicine

## 2022-11-27 DIAGNOSIS — E1165 Type 2 diabetes mellitus with hyperglycemia: Secondary | ICD-10-CM

## 2022-11-27 MED ORDER — TRULICITY 0.75 MG/0.5ML ~~LOC~~ SOAJ: 0.7500 mg | SUBCUTANEOUS | 0 refills | Status: DC

## 2022-11-27 NOTE — Telephone Encounter (Signed)
Patient left VM stating that her pharmacy has Trulicity in stock - should she start back taking this? She also states that her insurance denied the Rybelsus. Please advise.

## 2022-11-30 ENCOUNTER — Telehealth: Payer: Self-pay | Admitting: Internal Medicine

## 2022-11-30 NOTE — Telephone Encounter (Signed)
Patient called in and states she is trying to wean herself off of clonidine because she believes it is causing her dry mouth. She would like instructions on how to wean her off. She uses a CPAP and takes the clonidine at night as well. States she was prescribed the clonidine for hot flashes and she still has them so she doesn't feel like it is helping anyway. Please advise.

## 2022-12-06 ENCOUNTER — Other Ambulatory Visit: Payer: Self-pay | Admitting: Internal Medicine

## 2022-12-06 DIAGNOSIS — G4733 Obstructive sleep apnea (adult) (pediatric): Secondary | ICD-10-CM | POA: Diagnosis not present

## 2022-12-11 ENCOUNTER — Ambulatory Visit: Payer: 59 | Admitting: Internal Medicine

## 2022-12-11 ENCOUNTER — Other Ambulatory Visit: Payer: 59

## 2022-12-11 DIAGNOSIS — F331 Major depressive disorder, recurrent, moderate: Secondary | ICD-10-CM | POA: Diagnosis not present

## 2022-12-17 DIAGNOSIS — L940 Localized scleroderma [morphea]: Secondary | ICD-10-CM | POA: Diagnosis not present

## 2022-12-17 DIAGNOSIS — L718 Other rosacea: Secondary | ICD-10-CM | POA: Diagnosis not present

## 2022-12-17 DIAGNOSIS — D225 Melanocytic nevi of trunk: Secondary | ICD-10-CM | POA: Diagnosis not present

## 2022-12-17 DIAGNOSIS — L728 Other follicular cysts of the skin and subcutaneous tissue: Secondary | ICD-10-CM | POA: Diagnosis not present

## 2022-12-17 DIAGNOSIS — L309 Dermatitis, unspecified: Secondary | ICD-10-CM | POA: Diagnosis not present

## 2022-12-17 DIAGNOSIS — D485 Neoplasm of uncertain behavior of skin: Secondary | ICD-10-CM | POA: Diagnosis not present

## 2022-12-18 ENCOUNTER — Other Ambulatory Visit: Payer: Self-pay | Admitting: Internal Medicine

## 2022-12-18 DIAGNOSIS — E1165 Type 2 diabetes mellitus with hyperglycemia: Secondary | ICD-10-CM

## 2022-12-24 ENCOUNTER — Other Ambulatory Visit: Payer: Self-pay | Admitting: Internal Medicine

## 2022-12-24 ENCOUNTER — Other Ambulatory Visit: Payer: 59

## 2022-12-24 DIAGNOSIS — E782 Mixed hyperlipidemia: Secondary | ICD-10-CM

## 2022-12-24 DIAGNOSIS — E1165 Type 2 diabetes mellitus with hyperglycemia: Secondary | ICD-10-CM | POA: Diagnosis not present

## 2022-12-24 DIAGNOSIS — I10 Essential (primary) hypertension: Secondary | ICD-10-CM

## 2022-12-24 DIAGNOSIS — E538 Deficiency of other specified B group vitamins: Secondary | ICD-10-CM | POA: Diagnosis not present

## 2022-12-25 ENCOUNTER — Encounter: Payer: Self-pay | Admitting: Internal Medicine

## 2022-12-25 ENCOUNTER — Ambulatory Visit (INDEPENDENT_AMBULATORY_CARE_PROVIDER_SITE_OTHER): Payer: 59 | Admitting: Internal Medicine

## 2022-12-25 ENCOUNTER — Ambulatory Visit (INDEPENDENT_AMBULATORY_CARE_PROVIDER_SITE_OTHER): Payer: 59

## 2022-12-25 ENCOUNTER — Telehealth: Payer: Self-pay | Admitting: Internal Medicine

## 2022-12-25 VITALS — BP 119/80 | HR 83 | Ht 65.0 in | Wt 224.8 lb

## 2022-12-25 DIAGNOSIS — E559 Vitamin D deficiency, unspecified: Secondary | ICD-10-CM | POA: Insufficient documentation

## 2022-12-25 DIAGNOSIS — Z13820 Encounter for screening for osteoporosis: Secondary | ICD-10-CM

## 2022-12-25 DIAGNOSIS — K219 Gastro-esophageal reflux disease without esophagitis: Secondary | ICD-10-CM

## 2022-12-25 DIAGNOSIS — I10 Essential (primary) hypertension: Secondary | ICD-10-CM | POA: Diagnosis not present

## 2022-12-25 DIAGNOSIS — F3289 Other specified depressive episodes: Secondary | ICD-10-CM | POA: Diagnosis not present

## 2022-12-25 DIAGNOSIS — E538 Deficiency of other specified B group vitamins: Secondary | ICD-10-CM

## 2022-12-25 DIAGNOSIS — M8589 Other specified disorders of bone density and structure, multiple sites: Secondary | ICD-10-CM | POA: Diagnosis not present

## 2022-12-25 DIAGNOSIS — E1165 Type 2 diabetes mellitus with hyperglycemia: Secondary | ICD-10-CM | POA: Diagnosis not present

## 2022-12-25 DIAGNOSIS — E782 Mixed hyperlipidemia: Secondary | ICD-10-CM | POA: Diagnosis not present

## 2022-12-25 HISTORY — DX: Deficiency of other specified B group vitamins: E53.8

## 2022-12-25 HISTORY — DX: Vitamin D deficiency, unspecified: E55.9

## 2022-12-25 LAB — POCT CBG (FASTING - GLUCOSE)-MANUAL ENTRY: Glucose Fasting, POC: 148 mg/dL — AB (ref 70–99)

## 2022-12-25 MED ORDER — DEXCOM G7 RECEIVER DEVI
0 refills | Status: DC
Start: 2022-12-25 — End: 2022-12-27

## 2022-12-25 MED ORDER — DEXCOM G7 SENSOR MISC
Status: AC
Start: 2022-12-25 — End: ?

## 2022-12-25 NOTE — Telephone Encounter (Signed)
Patient left VM needing CGM supplies sent to [facility]. Dexom sensor and receiver have already been sent today.

## 2022-12-25 NOTE — Progress Notes (Signed)
Established Patient Office Visit  Subjective:  Patient ID: Sandra Le, female    DOB: [birth year]  Age: 62 y.o. MRN: [id number]  Chief Complaint  Patient presents with   Follow-up    3 MO F/U    Patient comes in for follow-up and to discuss her labs.  Her hemoglobin A1c has gone up to 7.9.  Patient understands it is due to an interruption in her diabetic medications.  Currently she is taking them regularly.  She is also on Trulicity injections 0.75 mg/week.  She will be increasing to the next dose in 2 weeks.  She has lost some weight. Complains of excessive hot flashes and night sweats.  Previously they were controlled by Clinidine but she had decided to stop it.  Agrees to resume at a small dose at bedtime.    No other concerns at this time.   Past Medical History:  Diagnosis Date   Hyperlipidemia    Hypertension     Past Surgical History:  Procedure Laterality Date   BARIATRIC SURGERY     CESAREAN SECTION      Social History   Socioeconomic History   Marital status: Married    Spouse name: Not on file   Number of children: Not on file   Years of education: Not on file   Highest education level: Not on file  Occupational History   Not on file  Tobacco Use   Smoking status: Former   Smokeless tobacco: Never  Vaping Use   Vaping status: Never Used  Substance and Sexual Activity   Alcohol use: Yes   Drug use: Not on file   Sexual activity: Not on file  Other Topics Concern   Not on file  Social History Narrative   Not on file   Social Determinants of Health   Financial Resource Strain: Not on file  Food Insecurity: Not on file  Transportation Needs: Not on file  Physical Activity: Not on file  Stress: Not on file  Social Connections: Not on file  Intimate Partner Violence: Not on file    Family History  Problem Relation Age of Onset   Breast cancer Neg Hx    Bladder Cancer Neg Hx    Kidney cancer Neg Hx     No Known Allergies  Review of  Systems  Constitutional: Negative.  Negative for chills, fever and malaise/fatigue.  HENT: Negative.    Eyes: Negative.   Respiratory: Negative.  Negative for cough and shortness of breath.   Cardiovascular: Negative.  Negative for chest pain, palpitations and leg swelling.  Gastrointestinal: Negative.  Negative for abdominal pain, constipation, diarrhea, heartburn, nausea and vomiting.  Genitourinary: Negative.  Negative for dysuria and flank pain.  Musculoskeletal: Negative.  Negative for joint pain and myalgias.  Skin: Negative.   Neurological: Negative.  Negative for dizziness and headaches.  Endo/Heme/Allergies: Negative.   Psychiatric/Behavioral: Negative.  Negative for depression and suicidal ideas. The patient is not nervous/anxious.        Objective:   BP 119/80   Pulse 83   Ht 5\' 5"  (1.651 m)   Wt 224 lb 12.8 oz (102 kg)   SpO2 92%   BMI 37.41 kg/m   Vitals:   12/25/22 1337  BP: 119/80  Pulse: 83  Height: 5\' 5"  (1.651 m)  Weight: 224 lb 12.8 oz (102 kg)  SpO2: 92%  BMI (Calculated): 37.41    Physical Exam Vitals and nursing note reviewed.  Constitutional:  Appearance: Normal appearance.  HENT:     Head: Normocephalic and atraumatic.     Nose: Nose normal.     Mouth/Throat:     Mouth: Mucous membranes are moist.     Pharynx: Oropharynx is clear.  Eyes:     Conjunctiva/sclera: Conjunctivae normal.     Pupils: Pupils are equal, round, and reactive to light.  Cardiovascular:     Rate and Rhythm: Normal rate and regular rhythm.     Pulses: Normal pulses.     Heart sounds: Normal heart sounds. No murmur heard. Pulmonary:     Effort: Pulmonary effort is normal.     Breath sounds: Normal breath sounds. No wheezing.  Abdominal:     General: Bowel sounds are normal.     Palpations: Abdomen is soft.     Tenderness: There is no abdominal tenderness. There is no right CVA tenderness or left CVA tenderness.  Musculoskeletal:        General: Normal range  of motion.     Cervical back: Normal range of motion.     Right lower leg: No edema.     Left lower leg: No edema.  Skin:    General: Skin is warm and dry.  Neurological:     General: No focal deficit present.     Mental Status: She is alert and oriented to person, place, and time.  Psychiatric:        Mood and Affect: Mood normal.        Behavior: Behavior normal.      Results for orders placed or performed in visit on 12/25/22  POCT CBG (Fasting - Glucose)  Result Value Ref Range   Glucose Fasting, POC 148 (A) 70 - 99 mg/dL    Recent Results (from the past 2160 hour(s))  POCT CBG (Fasting - Glucose)     Status: Abnormal   Collection Time: 10/02/22 11:07 AM  Result Value Ref Range   Glucose Fasting, POC 138 (A) 70 - 99 mg/dL  POCT CBG (Fasting - Glucose)     Status: Abnormal   Collection Time: 11/12/22 11:57 AM  Result Value Ref Range   Glucose Fasting, POC 225 (A) 70 - 99 mg/dL  [id number]     Status: Abnormal   Collection Time: 12/24/22 12:21 PM  Result Value Ref Range   Glucose 115 (H) 70 - 99 mg/dL   BUN 12 8 - 27 mg/dL   Creatinine, Ser 5.28 0.57 - 1.00 mg/dL   eGFR 79 >41 LK/GMW/1.02   BUN/Creatinine Ratio 14 12 - 28   Sodium 139 134 - 144 mmol/L   Potassium 4.6 3.5 - 5.2 mmol/L   Chloride 100 96 - 106 mmol/L   CO2 24 20 - 29 mmol/L   Calcium 9.4 8.7 - 10.3 mg/dL   Total Protein 6.9 6.0 - 8.5 g/dL   Albumin 4.4 3.9 - 4.9 g/dL   Globulin, Total 2.5 1.5 - 4.5 g/dL   Bilirubin Total 0.5 0.0 - 1.2 mg/dL   Alkaline Phosphatase 81 44 - 121 IU/L   AST 24 0 - 40 IU/L   ALT 20 0 - 32 IU/L  Lipid Panel w/o Chol/HDL Ratio     Status: None   Collection Time: 12/24/22 12:21 PM  Result Value Ref Range   Cholesterol, Total 174 100 - 199 mg/dL   Triglycerides 725 0 - 149 mg/dL   HDL 77 >36 mg/dL   VLDL Cholesterol Cal 25 5 - 40 mg/dL   LDL Chol  Calc (NIH) 72 0 - 99 mg/dL  Hemoglobin L8V     Status: Abnormal   Collection Time: 12/24/22 12:21 PM  Result Value Ref  Range   Hgb A1c MFr Bld 7.9 (H) 4.8 - 5.6 %    Comment:          Prediabetes: 5.7 - 6.4          Diabetes: >6.4          Glycemic control for adults with diabetes: <7.0    Est. average glucose Bld gHb Est-mCnc 180 mg/dL  POCT CBG (Fasting - Glucose)     Status: Abnormal   Collection Time: 12/25/22  1:45 PM  Result Value Ref Range   Glucose Fasting, POC 148 (A) 70 - 99 mg/dL      Assessment & Plan:  Patient advised to continue all her current medications and to take them regularly.  Needs strict diet control.  Return in 1 month to check weight and glucose control.  Will send in prescription for CGM device. Problem List Items Addressed This Visit     Depression   GERD (gastroesophageal reflux disease)   Essential hypertension, benign   Relevant Medications   amLODipine (NORVASC) 5 MG tablet   Hyperlipemia   Relevant Medications   amLODipine (NORVASC) 5 MG tablet   T2DM (type 2 diabetes mellitus) (HCC) - Primary   Relevant Medications   Continuous Glucose Receiver (DEXCOM G7 RECEIVER) DEVI   Continuous Glucose Sensor (DEXCOM G7 SENSOR) MISC   Other Relevant Orders   POCT CBG (Fasting - Glucose) (Completed)   Vitamin D deficiency   Vitamin B12 deficiency   Relevant Orders   Vitamin B12    Return in about 4 weeks (around 01/22/2023).   Total time spent: 30 minutes  Margaretann Loveless, MD  12/25/2022   This document may have been prepared by [facility] Voice Recognition software and as such may include unintentional dictation errors.

## 2022-12-26 ENCOUNTER — Other Ambulatory Visit: Payer: Self-pay | Admitting: Internal Medicine

## 2022-12-26 ENCOUNTER — Telehealth: Payer: Self-pay

## 2022-12-26 DIAGNOSIS — L570 Actinic keratosis: Secondary | ICD-10-CM | POA: Diagnosis not present

## 2022-12-26 DIAGNOSIS — E1165 Type 2 diabetes mellitus with hyperglycemia: Secondary | ICD-10-CM

## 2022-12-26 DIAGNOSIS — C44519 Basal cell carcinoma of skin of other part of trunk: Secondary | ICD-10-CM | POA: Diagnosis not present

## 2022-12-26 MED ORDER — DEXCOM G7 SENSOR MISC
1 refills | Status: DC
Start: 2022-12-26 — End: 2024-02-04

## 2022-12-27 NOTE — Progress Notes (Signed)
Patient notified

## 2022-12-28 NOTE — Telephone Encounter (Signed)
Verbal order made by Katherine Mantle.

## 2023-01-03 DIAGNOSIS — F39 Unspecified mood [affective] disorder: Secondary | ICD-10-CM | POA: Diagnosis not present

## 2023-01-03 DIAGNOSIS — F5105 Insomnia due to other mental disorder: Secondary | ICD-10-CM | POA: Diagnosis not present

## 2023-01-03 DIAGNOSIS — F411 Generalized anxiety disorder: Secondary | ICD-10-CM | POA: Diagnosis not present

## 2023-01-03 DIAGNOSIS — F4312 Post-traumatic stress disorder, chronic: Secondary | ICD-10-CM | POA: Diagnosis not present

## 2023-01-03 IMAGING — MG MM DIGITAL SCREENING BILAT W/ TOMO AND CAD
8 series · 8 of 24 positions shown · non-contrast
Comparison: Previous exam(s).

CLINICAL DATA: Screening.

EXAM:
DIGITAL SCREENING BILATERAL MAMMOGRAM WITH TOMOSYNTHESIS AND CAD
TECHNIQUE: Bilateral screening digital craniocaudal and mediolateral oblique
mammograms were obtained. Bilateral screening digital breast
tomosynthesis was performed. The images were evaluated with
computer-aided detection.

[L CC synth-2D]
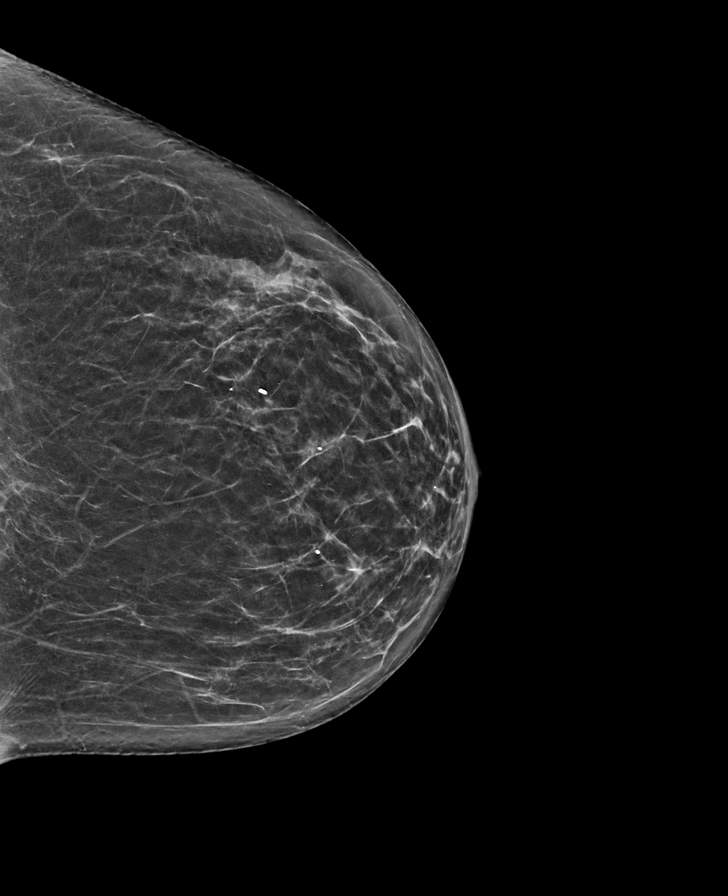

[L MLO synth-2D]
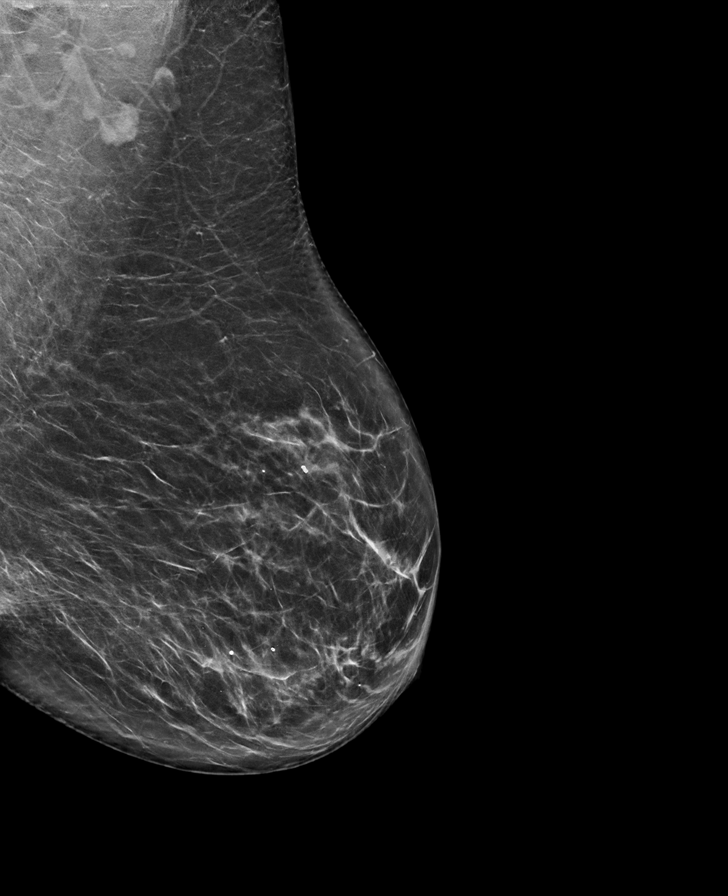

[R CC synth-2D]
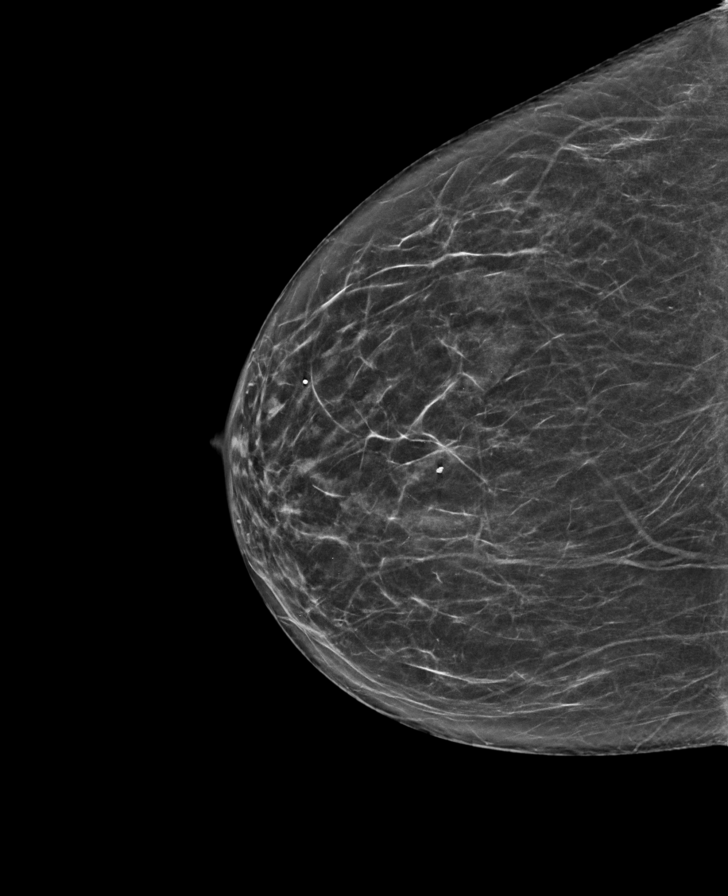

[R MLO synth-2D]
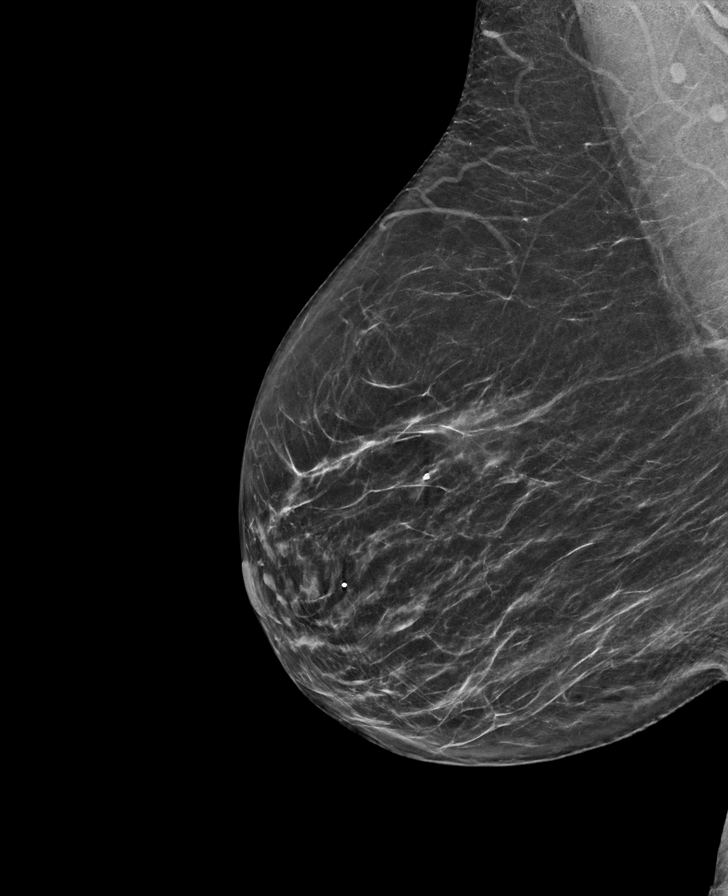

[R MLO tomo · tomo slice 34/67.0]
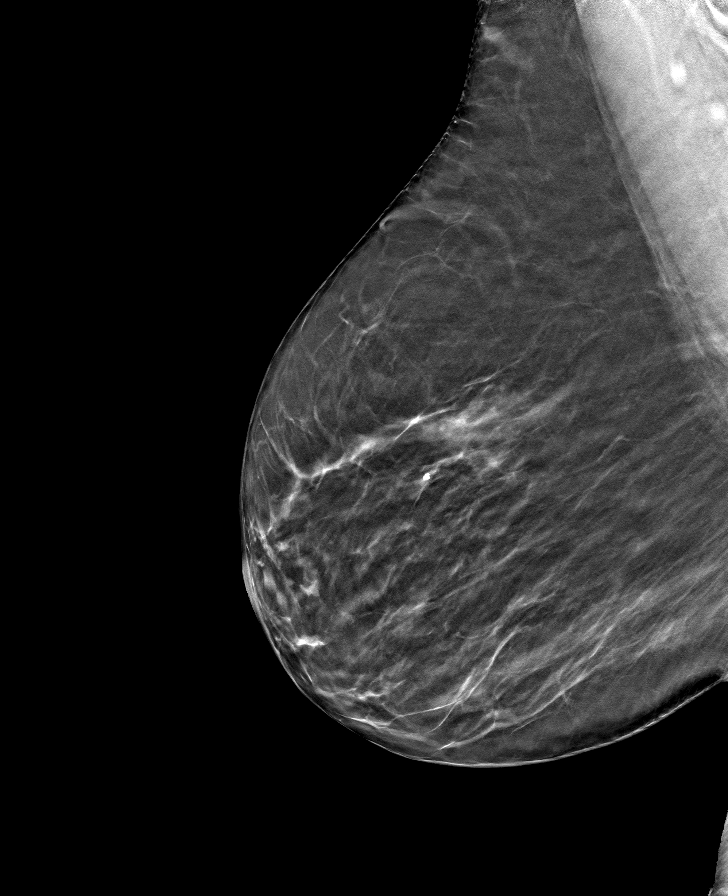

[L CC tomo · tomo slice 32/63.0]
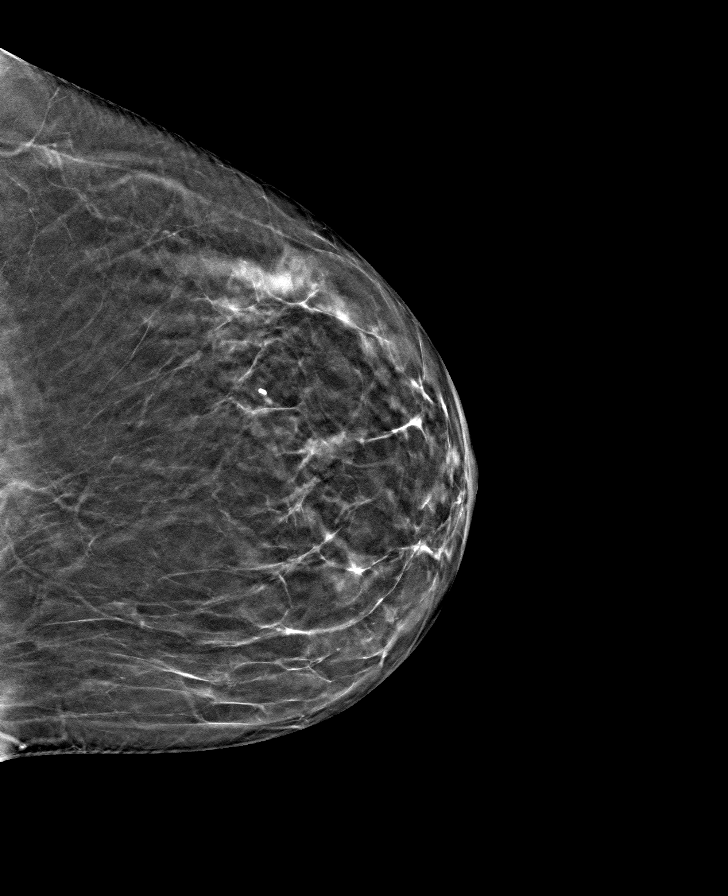

[L MLO tomo · tomo slice 42/83.0]
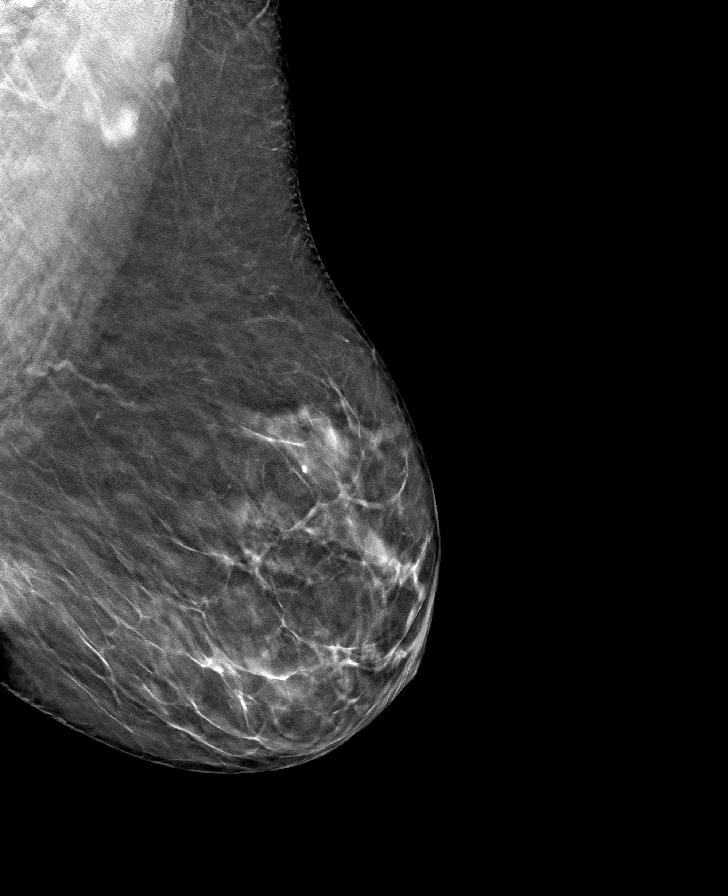

[R CC tomo · tomo slice 31/60.0]
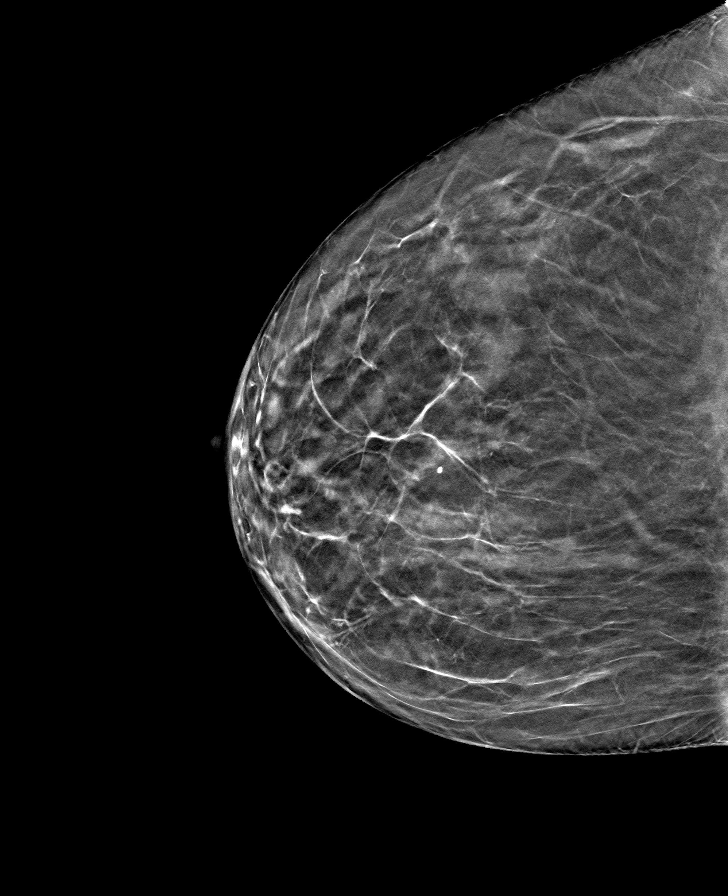

[8 of 24 positions shown; findings below may reference images not displayed]

ACR Breast Density Category b: There are scattered areas of
fibroglandular density.
FINDINGS: There are no findings suspicious for malignancy.
IMPRESSION: No mammographic evidence of malignancy. A result letter of this
screening mammogram will be mailed directly to the patient.

RECOMMENDATION:
Screening mammogram in one year. (Code:51-O-LD2)

BI-RADS CATEGORY  1: Negative.

## 2023-01-04 ENCOUNTER — Other Ambulatory Visit: Payer: Self-pay | Admitting: Internal Medicine

## 2023-01-06 DIAGNOSIS — G4733 Obstructive sleep apnea (adult) (pediatric): Secondary | ICD-10-CM | POA: Diagnosis not present

## 2023-01-08 DIAGNOSIS — F331 Major depressive disorder, recurrent, moderate: Secondary | ICD-10-CM | POA: Diagnosis not present

## 2023-01-15 DIAGNOSIS — J301 Allergic rhinitis due to pollen: Secondary | ICD-10-CM | POA: Diagnosis not present

## 2023-01-15 DIAGNOSIS — G4733 Obstructive sleep apnea (adult) (pediatric): Secondary | ICD-10-CM | POA: Diagnosis not present

## 2023-01-15 DIAGNOSIS — J320 Chronic maxillary sinusitis: Secondary | ICD-10-CM | POA: Diagnosis not present

## 2023-01-22 ENCOUNTER — Encounter: Payer: Self-pay | Admitting: Internal Medicine

## 2023-01-22 ENCOUNTER — Ambulatory Visit: Payer: 59 | Admitting: Internal Medicine

## 2023-01-22 VITALS — BP 106/72 | HR 75 | Ht 65.0 in | Wt 223.6 lb

## 2023-01-22 DIAGNOSIS — K219 Gastro-esophageal reflux disease without esophagitis: Secondary | ICD-10-CM | POA: Diagnosis not present

## 2023-01-22 DIAGNOSIS — I10 Essential (primary) hypertension: Secondary | ICD-10-CM | POA: Diagnosis not present

## 2023-01-22 DIAGNOSIS — E782 Mixed hyperlipidemia: Secondary | ICD-10-CM

## 2023-01-22 DIAGNOSIS — E1165 Type 2 diabetes mellitus with hyperglycemia: Secondary | ICD-10-CM

## 2023-01-22 LAB — POCT CBG (FASTING - GLUCOSE)-MANUAL ENTRY: Glucose Fasting, POC: 130 mg/dL — AB (ref 70–99)

## 2023-01-22 NOTE — Progress Notes (Signed)
Established Patient Office Visit  Subjective:  Patient ID: Sandra Le, female    DOB: 12-12-1960  Age: 62 y.o. MRN: [id number]  Chief Complaint  Patient presents with   Follow-up    4 week follow up    Patient comes in for follow-up.  She is now wearing a Dexcom 7 and likes the convenience.  However she occasionally gets an alarm in the middle of the night of low blood sugar, drinks orange juice or takes glucose tablets, although she is not having any symptoms of hypoglycemia.  Patient advised to check her fingerstick glucose before eating anything sweet. Currently she is on Trulicity 1.5 mg once a week and glimepiride 2 mg once a day only.  She did not lose any significant amount of weight this visit.  Will not change any of the medications other than follow a strict diabetic diet and do a fingerstick glucose as a backup. She is using clonidine tablets to help with her hot flashes and is feeling much better.    No other concerns at this time.   Past Medical History:  Diagnosis Date   Hyperlipidemia    Hypertension     Past Surgical History:  Procedure Laterality Date   BARIATRIC SURGERY     CESAREAN SECTION      Social History   Socioeconomic History   Marital status: Married    Spouse name: Not on file   Number of children: Not on file   Years of education: Not on file   Highest education level: Not on file  Occupational History   Not on file  Tobacco Use   Smoking status: Former   Smokeless tobacco: Never  Vaping Use   Vaping status: Never Used  Substance and Sexual Activity   Alcohol use: Yes   Drug use: Not on file   Sexual activity: Not on file  Other Topics Concern   Not on file  Social History Narrative   Not on file   Social Determinants of Health   Financial Resource Strain: Not on file  Food Insecurity: Not on file  Transportation Needs: Not on file  Physical Activity: Not on file  Stress: Not on file  Social Connections: Not on file   Intimate Partner Violence: Not on file    Family History  Problem Relation Age of Onset   Breast cancer Neg Hx    Bladder Cancer Neg Hx    Kidney cancer Neg Hx     No Known Allergies  Review of Systems  Constitutional: Negative.  Negative for chills, diaphoresis, fever, malaise/fatigue and weight loss.  HENT: Negative.  Negative for congestion, ear discharge and sinus pain.   Eyes: Negative.   Respiratory: Negative.  Negative for cough and shortness of breath.   Cardiovascular: Negative.  Negative for chest pain, palpitations and leg swelling.  Gastrointestinal: Negative.  Negative for abdominal pain, constipation, diarrhea, heartburn, nausea and vomiting.  Genitourinary: Negative.  Negative for dysuria and flank pain.  Musculoskeletal: Negative.  Negative for joint pain and myalgias.  Skin: Negative.   Neurological: Negative.  Negative for dizziness and headaches.  Endo/Heme/Allergies: Negative.   Psychiatric/Behavioral: Negative.  Negative for depression and suicidal ideas. The patient is not nervous/anxious.        Objective:   BP 106/72   Pulse 75   Ht 5\' 5"  (1.651 m)   Wt 223 lb 9.6 oz (101.4 kg)   SpO2 96%   BMI 37.21 kg/m   Vitals:  01/22/23 1402  BP: 106/72  Pulse: 75  Height: 5\' 5"  (1.651 m)  Weight: 223 lb 9.6 oz (101.4 kg)  SpO2: 96%  BMI (Calculated): 37.21    Physical Exam Vitals and nursing note reviewed.  Constitutional:      Appearance: Normal appearance.  HENT:     Head: Normocephalic and atraumatic.     Nose: Nose normal.     Mouth/Throat:     Mouth: Mucous membranes are moist.     Pharynx: Oropharynx is clear.  Eyes:     Conjunctiva/sclera: Conjunctivae normal.     Pupils: Pupils are equal, round, and reactive to light.  Cardiovascular:     Rate and Rhythm: Normal rate and regular rhythm.     Pulses: Normal pulses.     Heart sounds: Normal heart sounds. No murmur heard. Pulmonary:     Effort: Pulmonary effort is normal.      Breath sounds: Normal breath sounds. No wheezing.  Abdominal:     General: Bowel sounds are normal.     Palpations: Abdomen is soft.     Tenderness: There is no abdominal tenderness. There is no right CVA tenderness or left CVA tenderness.  Musculoskeletal:        General: Normal range of motion.     Cervical back: Normal range of motion.     Right lower leg: No edema.     Left lower leg: No edema.  Skin:    General: Skin is warm and dry.  Neurological:     General: No focal deficit present.     Mental Status: She is alert and oriented to person, place, and time.  Psychiatric:        Mood and Affect: Mood normal.        Behavior: Behavior normal.      Results for orders placed or performed in visit on 01/22/23  POCT CBG (Fasting - Glucose)  Result Value Ref Range   Glucose Fasting, POC 130 (A) 70 - 99 mg/dL    Recent Results (from the past 2160 hour(s))  POCT CBG (Fasting - Glucose)     Status: Abnormal   Collection Time: 11/12/22 11:57 AM  Result Value Ref Range   Glucose Fasting, POC 225 (A) 70 - 99 mg/dL  [id number]     Status: Abnormal   Collection Time: 12/24/22 12:21 PM  Result Value Ref Range   Glucose 115 (H) 70 - 99 mg/dL   BUN 12 8 - 27 mg/dL   Creatinine, Ser 4.78 0.57 - 1.00 mg/dL   eGFR 79 >29 FA/OZH/0.86   BUN/Creatinine Ratio 14 12 - 28   Sodium 139 134 - 144 mmol/L   Potassium 4.6 3.5 - 5.2 mmol/L   Chloride 100 96 - 106 mmol/L   CO2 24 20 - 29 mmol/L   Calcium 9.4 8.7 - 10.3 mg/dL   Total Protein 6.9 6.0 - 8.5 g/dL   Albumin 4.4 3.9 - 4.9 g/dL   Globulin, Total 2.5 1.5 - 4.5 g/dL   Bilirubin Total 0.5 0.0 - 1.2 mg/dL   Alkaline Phosphatase 81 44 - 121 IU/L   AST 24 0 - 40 IU/L   ALT 20 0 - 32 IU/L  Lipid Panel w/o Chol/HDL Ratio     Status: None   Collection Time: 12/24/22 12:21 PM  Result Value Ref Range   Cholesterol, Total 174 100 - 199 mg/dL   Triglycerides 578 0 - 149 mg/dL   HDL 77 >46 mg/dL   VLDL  Cholesterol Cal 25 5 - 40 mg/dL    LDL Chol Calc (NIH) 72 0 - 99 mg/dL  Hemoglobin Z6X     Status: Abnormal   Collection Time: 12/24/22 12:21 PM  Result Value Ref Range   Hgb A1c MFr Bld 7.9 (H) 4.8 - 5.6 %    Comment:          Prediabetes: 5.7 - 6.4          Diabetes: >6.4          Glycemic control for adults with diabetes: <7.0    Est. average glucose Bld gHb Est-mCnc 180 mg/dL  Vitamin W96     Status: None   Collection Time: 12/24/22 12:21 PM  Result Value Ref Range   Vitamin B-12 1,002 232 - 1,245 pg/mL  Specimen status report     Status: None   Collection Time: 12/24/22 12:21 PM  Result Value Ref Range   specimen status report Comment     Comment: Written Authorization Written Authorization Written Authorization Received. Authorization received from [facility] 12-26-2022 Logged by Gabriel Rung   POCT CBG (Fasting - Glucose)     Status: Abnormal   Collection Time: 12/25/22  1:45 PM  Result Value Ref Range   Glucose Fasting, POC 148 (A) 70 - 99 mg/dL  POCT CBG (Fasting - Glucose)     Status: Abnormal   Collection Time: 01/22/23  2:07 PM  Result Value Ref Range   Glucose Fasting, POC 130 (A) 70 - 99 mg/dL      Assessment & Plan:  Patient advised to continue all her current medications. Continue to monitor blood pressure and sugars at home. Problem List Items Addressed This Visit     GERD (gastroesophageal reflux disease)   Essential hypertension, benign   Hyperlipemia   T2DM (type 2 diabetes mellitus) (HCC) - Primary   Relevant Medications   TRULICITY 3 MG/0.5ML SOPN   Other Relevant Orders   POCT CBG (Fasting - Glucose) (Completed)    Return in about 2 months (around 03/24/2023).   Total time spent: 25 minutes  Margaretann Loveless, MD  01/22/2023   This document may have been prepared by [facility] Voice Recognition software and as such may include unintentional dictation errors.

## 2023-02-05 ENCOUNTER — Telehealth: Payer: Self-pay | Admitting: Internal Medicine

## 2023-02-05 MED ORDER — LEVOFLOXACIN 500 MG PO TABS: 500.0000 mg | ORAL_TABLET | Freq: Every day | ORAL | 0 refills | Status: AC

## 2023-02-05 NOTE — Telephone Encounter (Signed)
Patient left VM that she has been sick over a week and thinks she needs abx. Wants to know if you will send something in or does she need to be seen?

## 2023-02-06 DIAGNOSIS — G4733 Obstructive sleep apnea (adult) (pediatric): Secondary | ICD-10-CM | POA: Diagnosis not present

## 2023-02-11 DIAGNOSIS — L720 Epidermal cyst: Secondary | ICD-10-CM | POA: Diagnosis not present

## 2023-02-12 DIAGNOSIS — F331 Major depressive disorder, recurrent, moderate: Secondary | ICD-10-CM | POA: Diagnosis not present

## 2023-03-08 DIAGNOSIS — G4733 Obstructive sleep apnea (adult) (pediatric): Secondary | ICD-10-CM | POA: Diagnosis not present

## 2023-03-19 DIAGNOSIS — F331 Major depressive disorder, recurrent, moderate: Secondary | ICD-10-CM | POA: Diagnosis not present

## 2023-03-20 ENCOUNTER — Encounter: Payer: Self-pay | Admitting: Internal Medicine

## 2023-03-20 DIAGNOSIS — E559 Vitamin D deficiency, unspecified: Secondary | ICD-10-CM

## 2023-03-20 DIAGNOSIS — E1165 Type 2 diabetes mellitus with hyperglycemia: Secondary | ICD-10-CM

## 2023-03-20 DIAGNOSIS — I10 Essential (primary) hypertension: Secondary | ICD-10-CM

## 2023-03-20 DIAGNOSIS — Z1321 Encounter for screening for nutritional disorder: Secondary | ICD-10-CM

## 2023-03-20 DIAGNOSIS — E782 Mixed hyperlipidemia: Secondary | ICD-10-CM

## 2023-03-20 DIAGNOSIS — E538 Deficiency of other specified B group vitamins: Secondary | ICD-10-CM

## 2023-03-25 ENCOUNTER — Ambulatory Visit: Payer: 59 | Admitting: Internal Medicine

## 2023-03-26 ENCOUNTER — Other Ambulatory Visit: Payer: 59

## 2023-03-26 DIAGNOSIS — Z1321 Encounter for screening for nutritional disorder: Secondary | ICD-10-CM | POA: Diagnosis not present

## 2023-03-26 DIAGNOSIS — E538 Deficiency of other specified B group vitamins: Secondary | ICD-10-CM | POA: Diagnosis not present

## 2023-03-26 DIAGNOSIS — I10 Essential (primary) hypertension: Secondary | ICD-10-CM | POA: Diagnosis not present

## 2023-03-26 DIAGNOSIS — E782 Mixed hyperlipidemia: Secondary | ICD-10-CM | POA: Diagnosis not present

## 2023-03-26 DIAGNOSIS — E559 Vitamin D deficiency, unspecified: Secondary | ICD-10-CM | POA: Diagnosis not present

## 2023-03-26 DIAGNOSIS — E1165 Type 2 diabetes mellitus with hyperglycemia: Secondary | ICD-10-CM | POA: Diagnosis not present

## 2023-03-27 LAB — CMP14+EGFR
ALT: 15 [IU]/L (ref 0–32)
AST: 21 [IU]/L (ref 0–40)
Albumin: 4.1 g/dL (ref 3.9–4.9)
Alkaline Phosphatase: 84 [IU]/L (ref 44–121)
BUN/Creatinine Ratio: 10 — ABNORMAL LOW (ref 12–28)
BUN: 9 mg/dL (ref 8–27)
Bilirubin Total: 0.5 mg/dL (ref 0.0–1.2)
CO2: 24 mmol/L (ref 20–29)
Calcium: 9.5 mg/dL (ref 8.7–10.3)
Chloride: 101 mmol/L (ref 96–106)
Creatinine, Ser: 0.88 mg/dL (ref 0.57–1.00)
Globulin, Total: 2.5 g/dL (ref 1.5–4.5)
Glucose: 97 mg/dL (ref 70–99)
Potassium: 4.6 mmol/L (ref 3.5–5.2)
Sodium: 140 mmol/L (ref 134–144)
Total Protein: 6.6 g/dL (ref 6.0–8.5)
eGFR: 74 mL/min/{1.73_m2} (ref >59)

## 2023-03-27 LAB — LIPID PANEL
Chol/HDL Ratio: 3 ratio (ref 0.0–4.4)
Cholesterol, Total: 173 mg/dL (ref 100–199)
HDL: 58 mg/dL (ref >39)
LDL Chol Calc (NIH): 95 mg/dL (ref 0–99)
Triglycerides: 109 mg/dL (ref 0–149)
VLDL Cholesterol Cal: 20 mg/dL (ref 5–40)

## 2023-03-27 LAB — VITAMIN D 25 HYDROXY (VIT D DEFICIENCY, FRACTURES): Vit D, 25-Hydroxy: 60.2 ng/mL (ref 30.0–100.0)

## 2023-03-27 LAB — HEMOGLOBIN A1C
Est. average glucose Bld gHb Est-mCnc: 143 mg/dL
Hgb A1c MFr Bld: 6.6 % — ABNORMAL HIGH (ref 4.8–5.6)

## 2023-03-27 LAB — MAGNESIUM: Magnesium: 1.9 mg/dL (ref 1.6–2.3)

## 2023-03-27 LAB — VITAMIN B12: Vitamin B-12: 1366 pg/mL — ABNORMAL HIGH (ref 232–1245)

## 2023-03-28 ENCOUNTER — Ambulatory Visit: Payer: 59 | Admitting: Internal Medicine

## 2023-03-28 ENCOUNTER — Encounter: Payer: Self-pay | Admitting: Internal Medicine

## 2023-03-28 VITALS — BP 122/86 | HR 84 | Ht 65.0 in | Wt 218.6 lb

## 2023-03-28 DIAGNOSIS — Z23 Encounter for immunization: Secondary | ICD-10-CM | POA: Diagnosis not present

## 2023-03-28 DIAGNOSIS — E1169 Type 2 diabetes mellitus with other specified complication: Secondary | ICD-10-CM | POA: Diagnosis not present

## 2023-03-28 DIAGNOSIS — I152 Hypertension secondary to endocrine disorders: Secondary | ICD-10-CM

## 2023-03-28 DIAGNOSIS — E1159 Type 2 diabetes mellitus with other circulatory complications: Secondary | ICD-10-CM

## 2023-03-28 DIAGNOSIS — E782 Mixed hyperlipidemia: Secondary | ICD-10-CM

## 2023-03-28 DIAGNOSIS — Z1231 Encounter for screening mammogram for malignant neoplasm of breast: Secondary | ICD-10-CM | POA: Diagnosis not present

## 2023-03-28 DIAGNOSIS — F5105 Insomnia due to other mental disorder: Secondary | ICD-10-CM | POA: Diagnosis not present

## 2023-03-28 DIAGNOSIS — E1165 Type 2 diabetes mellitus with hyperglycemia: Secondary | ICD-10-CM | POA: Diagnosis not present

## 2023-03-28 DIAGNOSIS — F4312 Post-traumatic stress disorder, chronic: Secondary | ICD-10-CM | POA: Diagnosis not present

## 2023-03-28 DIAGNOSIS — F39 Unspecified mood [affective] disorder: Secondary | ICD-10-CM | POA: Diagnosis not present

## 2023-03-28 DIAGNOSIS — F411 Generalized anxiety disorder: Secondary | ICD-10-CM | POA: Diagnosis not present

## 2023-03-28 LAB — POCT CBG (FASTING - GLUCOSE)-MANUAL ENTRY: Glucose Fasting, POC: 138 mg/dL — AB (ref 70–99)

## 2023-03-28 LAB — POC CREATINE & ALBUMIN,URINE
Albumin/Creatinine Ratio, Urine, POC: <30
Creatinine, POC: 50 mg/dL
Microalbumin Ur, POC: 10 mg/L

## 2023-03-28 MED ORDER — TRULICITY 4.5 MG/0.5ML ~~LOC~~ SOAJ: 4.5000 mg | SUBCUTANEOUS | 6 refills | Status: DC

## 2023-03-28 NOTE — Progress Notes (Signed)
Established Patient Office Visit  Subjective:  Patient ID: Sandra Le, female    DOB: May 13, 1961  Age: 62 y.o. MRN: [id number]  Chief Complaint  Patient presents with   Follow-up    2 month follow up    Patient comes in for her follow-up and to discuss her lab results.  She is using Trulicity at 3 mg/week and shows a marked improvement in her hemoglobin A1c.  It has dropped to 6.6 from 7.9.  She is controlling her diet as well as being more physically active, so has lost some weight as well.  Some of her psychiatric medications have also been cut down.  Overall she is feeling well. Mentions mild postnasal drip in the mornings. Need to schedule her mammogram. She is up-to-date on her DEXA and eye exam. Urine microalbumin done today. Foot exam is unremarkable.  Diabetes Pertinent negatives for hypoglycemia include no dizziness, headaches or nervousness/anxiousness. Pertinent negatives for diabetes include no blurred vision, no chest pain, no fatigue, no foot paresthesias, no foot ulcerations, no polydipsia, no polyphagia, no polyuria, no visual change and no weakness.    No other concerns at this time.   Past Medical History:  Diagnosis Date   Hyperlipidemia    Hypertension     Past Surgical History:  Procedure Laterality Date   BARIATRIC SURGERY     CESAREAN SECTION      Social History   Socioeconomic History   Marital status: Married    Spouse name: Not on file   Number of children: Not on file   Years of education: Not on file   Highest education level: Not on file  Occupational History   Not on file  Tobacco Use   Smoking status: Former   Smokeless tobacco: Never  Vaping Use   Vaping status: Never Used  Substance and Sexual Activity   Alcohol use: Yes   Drug use: Not on file   Sexual activity: Not on file  Other Topics Concern   Not on file  Social History Narrative   Not on file   Social Determinants of Health   Financial Resource Strain: Not on  file  Food Insecurity: Not on file  Transportation Needs: Not on file  Physical Activity: Not on file  Stress: Not on file  Social Connections: Not on file  Intimate Partner Violence: Not on file    Family History  Problem Relation Age of Onset   Breast cancer Neg Hx    Bladder Cancer Neg Hx    Kidney cancer Neg Hx     No Known Allergies  Review of Systems  Constitutional: Negative.  Negative for chills, diaphoresis, fatigue, fever and malaise/fatigue.  HENT: Negative.    Eyes: Negative.  Negative for blurred vision.  Respiratory: Negative.  Negative for cough and shortness of breath.   Cardiovascular: Negative.  Negative for chest pain, palpitations and leg swelling.  Gastrointestinal: Negative.  Negative for abdominal pain, constipation, diarrhea, heartburn, nausea and vomiting.  Genitourinary: Negative.  Negative for dysuria and flank pain.  Musculoskeletal: Negative.  Negative for joint pain and myalgias.  Skin: Negative.   Neurological: Negative.  Negative for dizziness, weakness and headaches.  Endo/Heme/Allergies: Negative.  Negative for polydipsia and polyphagia.  Psychiatric/Behavioral: Negative.  Negative for depression and suicidal ideas. The patient is not nervous/anxious.        Objective:   BP 122/86   Pulse 84   Ht 5\' 5"  (1.651 m)   Wt 218 lb 9.6 oz (  99.2 kg)   SpO2 95%   BMI 36.38 kg/m   Vitals:   03/28/23 1148  BP: 122/86  Pulse: 84  Height: 5\' 5"  (1.651 m)  Weight: 218 lb 9.6 oz (99.2 kg)  SpO2: 95%  BMI (Calculated): 36.38    Physical Exam Vitals and nursing note reviewed.  Constitutional:      Appearance: Normal appearance.  HENT:     Head: Normocephalic and atraumatic.     Nose: Nose normal.     Mouth/Throat:     Mouth: Mucous membranes are moist.     Pharynx: Oropharynx is clear.  Eyes:     Conjunctiva/sclera: Conjunctivae normal.     Pupils: Pupils are equal, round, and reactive to light.  Cardiovascular:     Rate and Rhythm:  Normal rate and regular rhythm.     Pulses:          Dorsalis pedis pulses are 1+ on the right side and 1+ on the left side.       Posterior tibial pulses are 1+ on the right side and 1+ on the left side.     Heart sounds: Normal heart sounds. No murmur heard. Pulmonary:     Effort: Pulmonary effort is normal.     Breath sounds: Normal breath sounds. No wheezing.  Abdominal:     General: Bowel sounds are normal.     Palpations: Abdomen is soft.     Tenderness: There is no abdominal tenderness. There is no right CVA tenderness or left CVA tenderness.  Musculoskeletal:        General: Normal range of motion.     Cervical back: Normal range of motion.     Right lower leg: No edema.     Left lower leg: No edema.  Feet:     Right foot:     Skin integrity: Skin integrity normal.     Toenail Condition: Right toenails are normal.     Left foot:     Skin integrity: Skin integrity normal.     Toenail Condition: Left toenails are normal.  Skin:    General: Skin is warm and dry.  Neurological:     General: No focal deficit present.     Mental Status: She is alert and oriented to person, place, and time.     Gait: Gait normal.     Deep Tendon Reflexes: Reflexes normal.  Psychiatric:        Mood and Affect: Mood normal.        Behavior: Behavior normal.      Results for orders placed or performed in visit on 03/28/23  POCT CBG (Fasting - Glucose)  Result Value Ref Range   Glucose Fasting, POC 138 (A) 70 - 99 mg/dL    Recent Results (from the past 2160 hour(s))  POCT CBG (Fasting - Glucose)     Status: Abnormal   Collection Time: 01/22/23  2:07 PM  Result Value Ref Range   Glucose Fasting, POC 130 (A) 70 - 99 mg/dL  Vitamin Y86     Status: Abnormal   Collection Time: 03/26/23 10:14 AM  Result Value Ref Range   Vitamin B-12 1,366 (H) 232 - 1,245 pg/mL  Vitamin D (25 hydroxy)     Status: None   Collection Time: 03/26/23 10:14 AM  Result Value Ref Range   Vit D, 25-Hydroxy 60.2  30.0 - 100.0 ng/mL    Comment: Vitamin D deficiency has been defined by the Institute of Medicine and an  Endocrine Society practice guideline as a level of serum 25-OH vitamin D less than 20 ng/mL (1,2). The Endocrine Society went on to further define vitamin D insufficiency as a level between 21 and 29 ng/mL (2). 1. IOM (Institute of Medicine). 2010. Dietary reference    intakes for calcium and D. [place]: The    [employer]. 2. Holick MF, Binkley , Bischoff-Ferrari HA, et al.    Evaluation, treatment, and prevention of vitamin D    deficiency: an Endocrine Society clinical practice    guideline. JCEM. 2011 Jul; 96(7):1911-30.   Magnesium     Status: None   Collection Time: 03/26/23 10:14 AM  Result Value Ref Range   Magnesium 1.9 1.6 - 2.3 mg/dL  [id number]     Status: Abnormal   Collection Time: 03/26/23 10:14 AM  Result Value Ref Range   Glucose 97 70 - 99 mg/dL   BUN 9 8 - 27 mg/dL   Creatinine, Ser 4.40 0.57 - 1.00 mg/dL   eGFR 74 >10 UV/OZD/6.64   BUN/Creatinine Ratio 10 (L) 12 - 28   Sodium 140 134 - 144 mmol/L   Potassium 4.6 3.5 - 5.2 mmol/L   Chloride 101 96 - 106 mmol/L   CO2 24 20 - 29 mmol/L   Calcium 9.5 8.7 - 10.3 mg/dL   Total Protein 6.6 6.0 - 8.5 g/dL   Albumin 4.1 3.9 - 4.9 g/dL   Globulin, Total 2.5 1.5 - 4.5 g/dL   Bilirubin Total 0.5 0.0 - 1.2 mg/dL   Alkaline Phosphatase 84 44 - 121 IU/L   AST 21 0 - 40 IU/L   ALT 15 0 - 32 IU/L  Lipid panel     Status: None   Collection Time: 03/26/23 10:16 AM  Result Value Ref Range   Cholesterol, Total 173 100 - 199 mg/dL   Triglycerides 403 0 - 149 mg/dL   HDL 58 >47 mg/dL   VLDL Cholesterol Cal 20 5 - 40 mg/dL   LDL Chol Calc (NIH) 95 0 - 99 mg/dL   Chol/HDL Ratio 3.0 0.0 - 4.4 ratio    Comment:                                   T. Chol/HDL Ratio                                             Men  Women                               1/2 Avg.Risk  3.4    3.3                                    Avg.Risk  5.0    4.4                                2X Avg.Risk  9.6    7.1                                3X  Avg.Risk 23.4   11.0   Hemoglobin A1c     Status: Abnormal   Collection Time: 03/26/23 10:16 AM  Result Value Ref Range   Hgb A1c MFr Bld 6.6 (H) 4.8 - 5.6 %    Comment:          Prediabetes: 5.7 - 6.4          Diabetes: >6.4          Glycemic control for adults with diabetes: <7.0    Est. average glucose Bld gHb Est-mCnc 143 mg/dL  POCT CBG (Fasting - Glucose)     Status: Abnormal   Collection Time: 03/28/23 11:56 AM  Result Value Ref Range   Glucose Fasting, POC 138 (A) 70 - 99 mg/dL      Assessment & Plan:  Increase dose of Trulicity to 4.5 mg/week.  Continue strict diet control and in all her medications as prescribed.. Schedule mammogram. Needs flu vaccine today.. Problem List Items Addressed This Visit     T2DM (type 2 diabetes mellitus) (HCC) - Primary   Relevant Medications   Dulaglutide (TRULICITY) 4.5 MG/0.5ML SOAJ   Other Relevant Orders   POCT CBG (Fasting - Glucose) (Completed)   Combined hyperlipidemia associated with type 2 diabetes mellitus (HCC)   Relevant Medications   Dulaglutide (TRULICITY) 4.5 MG/0.5ML SOAJ   Other Visit Diagnoses     Encounter for screening mammogram for malignant neoplasm of breast       Relevant Orders   MM 3D SCREENING MAMMOGRAM BILATERAL BREAST   Need for immunization against influenza       Relevant Orders   Influenza, MDCK, trivalent, PF(Flucelvax egg-free) (Completed)   Hypertension associated with diabetes (HCC)       Relevant Medications   Dulaglutide (TRULICITY) 4.5 MG/0.5ML SOAJ       Return in about 3 months (around 06/28/2023).   Total time spent: 30 minutes  Margaretann Loveless, MD  03/28/2023   This document may have been prepared by [facility] Voice Recognition software and as such may include unintentional dictation errors.

## 2023-03-28 NOTE — Addendum Note (Signed)
Addended byKatherine Mantle on: 03/28/2023 02:20 PM   Modules accepted: Orders

## 2023-04-01 ENCOUNTER — Other Ambulatory Visit: Payer: Self-pay | Admitting: Internal Medicine

## 2023-04-01 ENCOUNTER — Encounter: Payer: Self-pay | Admitting: Internal Medicine

## 2023-04-01 DIAGNOSIS — E119 Type 2 diabetes mellitus without complications: Secondary | ICD-10-CM

## 2023-04-08 ENCOUNTER — Other Ambulatory Visit: Payer: Self-pay | Admitting: Internal Medicine

## 2023-04-08 ENCOUNTER — Telehealth: Payer: Self-pay | Admitting: Internal Medicine

## 2023-04-08 DIAGNOSIS — E119 Type 2 diabetes mellitus without complications: Secondary | ICD-10-CM

## 2023-04-08 DIAGNOSIS — G4733 Obstructive sleep apnea (adult) (pediatric): Secondary | ICD-10-CM | POA: Diagnosis not present

## 2023-04-08 DIAGNOSIS — E1165 Type 2 diabetes mellitus with hyperglycemia: Secondary | ICD-10-CM

## 2023-04-08 NOTE — Telephone Encounter (Signed)
Patient left VM that she is having trouble getting her Trulicity from the pharmacy.  I called and spoke with Dorena at [facility] and she said that the Trulicity 4.5 mg is on long term backorder >30 days. Is there an alternative we can send in?

## 2023-04-09 MED ORDER — TRULICITY 4.5 MG/0.5ML ~~LOC~~ SOAJ: 4.5000 mg | SUBCUTANEOUS | 6 refills | Status: DC

## 2023-04-09 NOTE — Telephone Encounter (Signed)
Patient left VM that she spoke with a friend who recommended that she check [facility] for the Trulicity 4.5 mg. She called and they do have it in stock, so I am sending the Rx to [facility] in [place] at her request.

## 2023-04-26 ENCOUNTER — Encounter: Payer: Self-pay | Admitting: Internal Medicine

## 2023-04-26 ENCOUNTER — Other Ambulatory Visit: Payer: Self-pay

## 2023-04-26 MED ORDER — GLIMEPIRIDE 2 MG PO TABS: 2.0000 mg | ORAL_TABLET | Freq: Every day | ORAL | 3 refills | Status: DC

## 2023-05-02 ENCOUNTER — Other Ambulatory Visit: Payer: Self-pay | Admitting: Internal Medicine

## 2023-05-02 ENCOUNTER — Ambulatory Visit: Payer: 59 | Admitting: Cardiovascular Disease

## 2023-05-08 DIAGNOSIS — G4733 Obstructive sleep apnea (adult) (pediatric): Secondary | ICD-10-CM | POA: Diagnosis not present

## 2023-05-29 DIAGNOSIS — F331 Major depressive disorder, recurrent, moderate: Secondary | ICD-10-CM | POA: Diagnosis not present

## 2023-06-08 DIAGNOSIS — G4733 Obstructive sleep apnea (adult) (pediatric): Secondary | ICD-10-CM | POA: Diagnosis not present

## 2023-06-11 DIAGNOSIS — F331 Major depressive disorder, recurrent, moderate: Secondary | ICD-10-CM | POA: Diagnosis not present

## 2023-06-26 DIAGNOSIS — F411 Generalized anxiety disorder: Secondary | ICD-10-CM | POA: Diagnosis not present

## 2023-06-26 DIAGNOSIS — F5105 Insomnia due to other mental disorder: Secondary | ICD-10-CM | POA: Diagnosis not present

## 2023-06-26 DIAGNOSIS — F4312 Post-traumatic stress disorder, chronic: Secondary | ICD-10-CM | POA: Diagnosis not present

## 2023-06-26 DIAGNOSIS — F39 Unspecified mood [affective] disorder: Secondary | ICD-10-CM | POA: Diagnosis not present

## 2023-06-27 ENCOUNTER — Ambulatory Visit
Admission: RE | Admit: 2023-06-27 | Discharge: 2023-06-27 | Disposition: A | Discharge status: Home / Self Care | Payer: 59 | Source: Ambulatory Visit | Attending: Internal Medicine | Admitting: Internal Medicine

## 2023-06-27 DIAGNOSIS — Z1231 Encounter for screening mammogram for malignant neoplasm of breast: Secondary | ICD-10-CM | POA: Diagnosis not present

## 2023-06-28 ENCOUNTER — Other Ambulatory Visit: Payer: 59

## 2023-06-28 DIAGNOSIS — E1165 Type 2 diabetes mellitus with hyperglycemia: Secondary | ICD-10-CM | POA: Diagnosis not present

## 2023-06-28 DIAGNOSIS — I152 Hypertension secondary to endocrine disorders: Secondary | ICD-10-CM | POA: Diagnosis not present

## 2023-06-28 DIAGNOSIS — E1159 Type 2 diabetes mellitus with other circulatory complications: Secondary | ICD-10-CM | POA: Diagnosis not present

## 2023-06-28 DIAGNOSIS — E782 Mixed hyperlipidemia: Secondary | ICD-10-CM

## 2023-06-29 LAB — CMP14+EGFR
ALT: 18 [IU]/L (ref 0–32)
AST: 27 [IU]/L (ref 0–40)
Albumin: 4.2 g/dL (ref 3.9–4.9)
Alkaline Phosphatase: 76 [IU]/L (ref 44–121)
BUN/Creatinine Ratio: 13 (ref 12–28)
BUN: 12 mg/dL (ref 8–27)
Bilirubin Total: 0.6 mg/dL (ref 0.0–1.2)
CO2: 24 mmol/L (ref 20–29)
Calcium: 9.5 mg/dL (ref 8.7–10.3)
Chloride: 103 mmol/L (ref 96–106)
Creatinine, Ser: 0.91 mg/dL (ref 0.57–1.00)
Globulin, Total: 2.5 g/dL (ref 1.5–4.5)
Glucose: 103 mg/dL — ABNORMAL HIGH (ref 70–99)
Potassium: 4.6 mmol/L (ref 3.5–5.2)
Sodium: 143 mmol/L (ref 134–144)
Total Protein: 6.7 g/dL (ref 6.0–8.5)
eGFR: 71 mL/min/{1.73_m2} (ref >59)

## 2023-06-29 LAB — LIPID PANEL
Chol/HDL Ratio: 2.1 {ratio} (ref 0.0–4.4)
Cholesterol, Total: 157 mg/dL (ref 100–199)
HDL: 74 mg/dL (ref >39)
LDL Chol Calc (NIH): 66 mg/dL (ref 0–99)
Triglycerides: 93 mg/dL (ref 0–149)
VLDL Cholesterol Cal: 17 mg/dL (ref 5–40)

## 2023-06-29 LAB — HEMOGLOBIN A1C
Est. average glucose Bld gHb Est-mCnc: 134 mg/dL
Hgb A1c MFr Bld: 6.3 % — ABNORMAL HIGH (ref 4.8–5.6)

## 2023-06-29 LAB — TSH: TSH: 2.38 u[IU]/mL (ref 0.450–4.500)

## 2023-07-01 ENCOUNTER — Encounter: Payer: Self-pay | Admitting: Internal Medicine

## 2023-07-01 ENCOUNTER — Ambulatory Visit (INDEPENDENT_AMBULATORY_CARE_PROVIDER_SITE_OTHER): Payer: 59 | Admitting: Internal Medicine

## 2023-07-01 VITALS — BP 134/80 | HR 81 | Ht 65.0 in | Wt 213.6 lb

## 2023-07-01 DIAGNOSIS — E782 Mixed hyperlipidemia: Secondary | ICD-10-CM | POA: Diagnosis not present

## 2023-07-01 DIAGNOSIS — E1165 Type 2 diabetes mellitus with hyperglycemia: Secondary | ICD-10-CM

## 2023-07-01 DIAGNOSIS — E1159 Type 2 diabetes mellitus with other circulatory complications: Secondary | ICD-10-CM | POA: Diagnosis not present

## 2023-07-01 DIAGNOSIS — F3289 Other specified depressive episodes: Secondary | ICD-10-CM | POA: Diagnosis not present

## 2023-07-01 DIAGNOSIS — I152 Hypertension secondary to endocrine disorders: Secondary | ICD-10-CM | POA: Insufficient documentation

## 2023-07-01 DIAGNOSIS — K219 Gastro-esophageal reflux disease without esophagitis: Secondary | ICD-10-CM | POA: Diagnosis not present

## 2023-07-01 LAB — POCT CBG (FASTING - GLUCOSE)-MANUAL ENTRY: Glucose Fasting, POC: 138 mg/dL — AB (ref 70–99)

## 2023-07-01 NOTE — Progress Notes (Signed)
 Established Patient Office Visit  Subjective:  Patient ID: Sandra Le, female    DOB: July 28, 1960  Age: 63 y.o. MRN: [id number]  Chief Complaint  Patient presents with   Follow-up    3 month follow up    Patient comes in for her follow-up today.  She is feeling well and has no new complaints.  She had labs done which show excellent glucose control with her hemoglobin A1c at 6.3.  Her lipid panel is also within normal limits.  Patient is exercising regularly and is also monitoring her diet closely, has lost more weight.    No other concerns at this time.   Past Medical History:  Diagnosis Date   Hyperlipidemia    Hypertension     Past Surgical History:  Procedure Laterality Date   BARIATRIC SURGERY     CESAREAN SECTION      Social History   Socioeconomic History   Marital status: Married    Spouse name: Not on file   Number of children: Not on file   Years of education: Not on file   Highest education level: Not on file  Occupational History   Not on file  Tobacco Use   Smoking status: Former   Smokeless tobacco: Never  Vaping Use   Vaping status: Never Used  Substance and Sexual Activity   Alcohol use: Yes   Drug use: Not on file   Sexual activity: Not on file  Other Topics Concern   Not on file  Social History Narrative   Not on file   Social Drivers of Health   Financial Resource Strain: Not on file  Food Insecurity: Not on file  Transportation Needs: Not on file  Physical Activity: Not on file  Stress: Not on file  Social Connections: Not on file  Intimate Partner Violence: Not on file    Family History  Problem Relation Age of Onset   Breast cancer Neg Hx    Bladder Cancer Neg Hx    Kidney cancer Neg Hx     No Known Allergies  Outpatient Medications Prior to Visit  Medication Sig   acetaminophen (TYLENOL) 500 MG tablet Take by mouth.   amLODipine  (NORVASC ) 5 MG tablet Take 5 mg by mouth daily.   b complex vitamins capsule Take 1  capsule by mouth daily.   Cholecalciferol (VITAMIN D3) 50 MCG (2000 UT) capsule Take 2,000 Units by mouth daily.   cloNIDine  (CATAPRES ) 0.3 MG tablet TAKE ONE TABLET BY MOUTH AT BEDTIME   Dulaglutide  (TRULICITY ) 4.5 MG/0.5ML SOAJ Inject 4.5 mg into the skin once a week.   DULoxetine (CYMBALTA) 60 MG capsule    glimepiride  (AMARYL ) 2 MG tablet Take 1 tablet (2 mg total) by mouth daily with breakfast.   hydrOXYzine (ATARAX) 50 MG tablet Take 50 mg by mouth every 8 (eight) hours as needed for anxiety.   Multiple Vitamin (MULTIVITAMIN) capsule Take by mouth.   QUEtiapine (SEROQUEL) 50 MG tablet Take 25 mg by mouth at bedtime.   sertraline (ZOLOFT) 100 MG tablet Take 100 mg by mouth daily.   simvastatin  (ZOCOR ) 20 MG tablet Take 1 tablet (20 mg total) by mouth daily.   Turmeric 400 MG CAPS Take by mouth.   Azelastine HCl 0.15 % SOLN Place into the nose. (Patient not taking: Reported on 07/01/2023)   Continuous Glucose Sensor (DEXCOM G7 SENSOR) MISC Use as directed (Patient not taking: Reported on 07/01/2023)   Continuous Glucose Sensor (DEXCOM G7 SENSOR) MISC I sensor  every 10 days (Patient not taking: Reported on 07/01/2023)   lisinopril (PRINIVIL,ZESTRIL) 5 MG tablet  (Patient not taking: Reported on 07/01/2023)   vitamin B-12 (CYANOCOBALAMIN ) 250 MCG tablet Take 250 mcg by mouth in the morning, at noon, and at bedtime. (Patient not taking: Reported on 07/01/2023)   No facility-administered medications prior to visit.    Review of Systems  Constitutional: Negative.  Negative for chills, diaphoresis, fever and malaise/fatigue.  HENT: Negative.  Negative for sinus pain and sore throat.   Eyes: Negative.   Respiratory: Negative.  Negative for cough, shortness of breath and stridor.   Cardiovascular: Negative.  Negative for chest pain, palpitations and leg swelling.  Gastrointestinal: Negative.  Negative for abdominal pain, constipation, diarrhea, heartburn, nausea and vomiting.  Genitourinary:  Negative.  Negative for dysuria and flank pain.  Musculoskeletal: Negative.  Negative for joint pain and myalgias.  Skin: Negative.   Neurological: Negative.  Negative for dizziness and headaches.  Endo/Heme/Allergies: Negative.   Psychiatric/Behavioral: Negative.  Negative for depression and suicidal ideas. The patient is not nervous/anxious.        Objective:   BP 134/80   Pulse 81   Ht 5\' 5"  (1.651 m)   Wt 213 lb 9.6 oz (96.9 kg)   SpO2 98%   BMI 35.54 kg/m   Vitals:   07/01/23 1100  BP: 134/80  Pulse: 81  Height: 5\' 5"  (1.651 m)  Weight: 213 lb 9.6 oz (96.9 kg)  SpO2: 98%  BMI (Calculated): 35.54    Physical Exam Vitals and nursing note reviewed.  Constitutional:      Appearance: Normal appearance.  HENT:     Head: Normocephalic and atraumatic.     Nose: Nose normal.     Mouth/Throat:     Mouth: Mucous membranes are moist.     Pharynx: Oropharynx is clear.  Eyes:     Conjunctiva/sclera: Conjunctivae normal.     Pupils: Pupils are equal, round, and reactive to light.  Cardiovascular:     Rate and Rhythm: Normal rate and regular rhythm.     Pulses: Normal pulses.     Heart sounds: Normal heart sounds. No murmur heard. Pulmonary:     Effort: Pulmonary effort is normal.     Breath sounds: Normal breath sounds. No wheezing.  Abdominal:     General: Bowel sounds are normal.     Palpations: Abdomen is soft.     Tenderness: There is no abdominal tenderness. There is no right CVA tenderness or left CVA tenderness.  Musculoskeletal:        General: Normal range of motion.     Cervical back: Normal range of motion.     Right lower leg: No edema.     Left lower leg: No edema.  Skin:    General: Skin is warm and dry.  Neurological:     General: No focal deficit present.     Mental Status: She is alert and oriented to person, place, and time.  Psychiatric:        Mood and Affect: Mood normal.        Behavior: Behavior normal.      Results for orders placed  or performed in visit on 07/01/23  POCT CBG (Fasting - Glucose)  Result Value Ref Range   Glucose Fasting, POC 138 (A) 70 - 99 mg/dL    Recent Results (from the past 2160 hours)  Hemoglobin A1c     Status: Abnormal   Collection Time: 06/28/23  9:47  AM  Result Value Ref Range   Hgb A1c MFr Bld 6.3 (H) 4.8 - 5.6 %    Comment:          Prediabetes: 5.7 - 6.4          Diabetes: >6.4          Glycemic control for adults with diabetes: <7.0    Est. average glucose Bld gHb Est-mCnc 134 mg/dL  TSH     Status: None   Collection Time: 06/28/23  9:47 AM  Result Value Ref Range   TSH 2.380 0.450 - 4.500 uIU/mL  CMP14+EGFR     Status: Abnormal   Collection Time: 06/28/23  9:47 AM  Result Value Ref Range   Glucose 103 (H) 70 - 99 mg/dL   BUN 12 8 - 27 mg/dL   Creatinine, Ser 9.14 0.57 - 1.00 mg/dL   eGFR 71 >78 GN/FAO/1.30   BUN/Creatinine Ratio 13 12 - 28   Sodium 143 134 - 144 mmol/L   Potassium 4.6 3.5 - 5.2 mmol/L   Chloride 103 96 - 106 mmol/L   CO2 24 20 - 29 mmol/L   Calcium 9.5 8.7 - 10.3 mg/dL   Total Protein 6.7 6.0 - 8.5 g/dL   Albumin 4.2 3.9 - 4.9 g/dL   Globulin, Total 2.5 1.5 - 4.5 g/dL   Bilirubin Total 0.6 0.0 - 1.2 mg/dL   Alkaline Phosphatase 76 44 - 121 IU/L   AST 27 0 - 40 IU/L   ALT 18 0 - 32 IU/L  Lipid panel     Status: None   Collection Time: 06/28/23  9:47 AM  Result Value Ref Range   Cholesterol, Total 157 100 - 199 mg/dL   Triglycerides 93 0 - 149 mg/dL   HDL 74 >86 mg/dL   VLDL Cholesterol Cal 17 5 - 40 mg/dL   LDL Chol Calc (NIH) 66 0 - 99 mg/dL   Chol/HDL Ratio 2.1 0.0 - 4.4 ratio    Comment:                                   T. Chol/HDL Ratio                                             Men  Women                               1/2 Avg.Risk  3.4    3.3                                   Avg.Risk  5.0    4.4                                2X Avg.Risk  9.6    7.1                                3X Avg.Risk 23.4   11.0   POCT CBG (Fasting - Glucose)      Status: Abnormal   Collection Time: 07/01/23  11:05 AM  Result Value Ref Range   Glucose Fasting, POC 138 (A) 70 - 99 mg/dL      Assessment & Plan:  Continue strict diet control, medications as well as exercise. Problem List Items Addressed This Visit     Depression   GERD (gastroesophageal reflux disease)   Hyperlipemia   T2DM (type 2 diabetes mellitus) (HCC)   Relevant Orders   POCT CBG (Fasting - Glucose) (Completed)   Hypertension associated with diabetes (HCC) - Primary    Return in about 3 months (around 09/28/2023).   Total time spent: 30 minutes  Aisha Hove, MD  07/01/2023   This document may have been prepared by [facility] Voice Recognition software and as such may include unintentional dictation errors.

## 2023-07-02 ENCOUNTER — Other Ambulatory Visit: Payer: Self-pay | Admitting: Internal Medicine

## 2023-07-02 DIAGNOSIS — E1165 Type 2 diabetes mellitus with hyperglycemia: Secondary | ICD-10-CM

## 2023-07-02 MED ORDER — TRULICITY 4.5 MG/0.5ML ~~LOC~~ SOAJ: 4.5000 mg | SUBCUTANEOUS | 6 refills | Status: DC

## 2023-07-03 ENCOUNTER — Encounter: Payer: Self-pay | Admitting: Internal Medicine

## 2023-07-03 DIAGNOSIS — E1165 Type 2 diabetes mellitus with hyperglycemia: Secondary | ICD-10-CM

## 2023-07-03 MED ORDER — TRULICITY 4.5 MG/0.5ML ~~LOC~~ SOAJ: 4.5000 mg | SUBCUTANEOUS | 6 refills | Status: DC

## 2023-07-09 ENCOUNTER — Encounter: Payer: Self-pay | Admitting: Internal Medicine

## 2023-07-09 DIAGNOSIS — G4733 Obstructive sleep apnea (adult) (pediatric): Secondary | ICD-10-CM | POA: Diagnosis not present

## 2023-07-19 ENCOUNTER — Encounter: Payer: Self-pay | Admitting: Family

## 2023-07-19 ENCOUNTER — Ambulatory Visit (INDEPENDENT_AMBULATORY_CARE_PROVIDER_SITE_OTHER): Payer: 59 | Admitting: Family

## 2023-07-19 VITALS — BP 124/84 | HR 88 | Temp 97.8°F | Ht 65.0 in | Wt 211.2 lb

## 2023-07-19 DIAGNOSIS — Z0130 Encounter for examination of blood pressure without abnormal findings: Secondary | ICD-10-CM

## 2023-07-19 DIAGNOSIS — E1165 Type 2 diabetes mellitus with hyperglycemia: Secondary | ICD-10-CM

## 2023-07-19 DIAGNOSIS — J069 Acute upper respiratory infection, unspecified: Secondary | ICD-10-CM

## 2023-07-19 LAB — POC INFLUENZA A&B (BINAX/QUICKVUE)
Influenza A, POC: NEGATIVE
Influenza B, POC: NEGATIVE

## 2023-07-19 MED ORDER — AMOXICILLIN-POT CLAVULANATE 875-125 MG PO TABS: 1.0000 | ORAL_TABLET | Freq: Two times a day (BID) | ORAL | 0 refills | Status: DC

## 2023-07-19 NOTE — Progress Notes (Signed)
 Acute Office Visit  Subjective:     Patient ID: Sandra Le, female    DOB: 1960/07/04, 63 y.o.   MRN: [id number]  Patient is in today for  Chief Complaint  Patient presents with   Cough   Nasal Congestion    Cough This is a new problem. The current episode started 1 to 4 weeks ago. The problem has been gradually improving. The problem occurs every few minutes. The cough is Productive of sputum. Associated symptoms include nasal congestion, postnasal drip and rhinorrhea. Nothing aggravates the symptoms. She has tried OTC cough suppressant, rest, body position changes, cool air and steroid inhaler for the symptoms. The treatment provided no relief.     Review of Systems  HENT:  Positive for postnasal drip and rhinorrhea.   Respiratory:  Positive for cough.         Objective:    BP 124/84   Pulse 88   Temp 97.8 F (36.6 C) (Tympanic)   Ht 5\' 5"  (1.651 m)   Wt 211 lb 3.2 oz (95.8 kg)   SpO2 95%   BMI 35.15 kg/m   Physical Exam Vitals and nursing note reviewed.  Constitutional:      Appearance: Normal appearance. She is normal weight.  HENT:     Head: Normocephalic.  Eyes:     Extraocular Movements: Extraocular movements intact.     Conjunctiva/sclera: Conjunctivae normal.     Pupils: Pupils are equal, round, and reactive to light.  Cardiovascular:     Rate and Rhythm: Normal rate.  Pulmonary:     Effort: Pulmonary effort is normal.  Neurological:     General: No focal deficit present.     Mental Status: She is alert and oriented to person, place, and time. Mental status is at baseline.  Psychiatric:        Mood and Affect: Mood normal.        Behavior: Behavior normal.        Thought Content: Thought content normal.        Judgment: Judgment normal.     Results for orders placed or performed in visit on 07/19/23  POC Influenza A&B (Binax test)  Result Value Ref Range   Influenza A, POC Negative Negative   Influenza B, POC Negative Negative     Recent Results (from the past 2160 hours)  POC Influenza A&B (Binax test)     Status: None   Collection Time: 07/19/23  3:39 PM  Result Value Ref Range   Influenza A, POC Negative Negative   Influenza B, POC Negative Negative  POCT Urinalysis Dipstick (40981)     Status: Abnormal   Collection Time: 08/21/23 11:16 AM  Result Value Ref Range   Color, UA YELLOW    Clarity, UA CLEAR    Glucose, UA Negative Negative   Bilirubin, UA 1+    Ketones, UA NEGATIVE    Spec Grav, UA 1.020 1.010 - 1.025   Blood, UA 3+    pH, UA 6.0 5.0 - 8.0   Protein, UA Positive (A) Negative   Urobilinogen, UA 1.0 0.2 or 1.0 E.U./dL   Nitrite, UA NEGATIVE    Leukocytes, UA Large (3+) (A) Negative   Appearance CLOUDY    Odor YES   Urinalysis, Routine w reflex microscopic     Status: Abnormal   Collection Time: 08/21/23  2:00 PM  Result Value Ref Range   Specific Gravity, UA 1.018 1.005 - 1.030   pH, UA 6.5 5.0 -  7.5   Color, UA Yellow Yellow   Appearance Ur Cloudy (A) Clear   Leukocytes,UA 3+ (A) Negative   Protein,UA 3+ (A) Negative/Trace   Glucose, UA Negative Negative   Ketones, UA Negative Negative   RBC, UA 2+ (A) Negative   Bilirubin, UA CANCELED     Comment: Test not performed. Unable to perform test due to current unavailability of reagents.  Result canceled by the ancillary.    Urobilinogen, Ur 1.0 0.2 - 1.0 mg/dL   Nitrite, UA Positive (A) Negative   Microscopic Examination See below:     Comment: Microscopic was indicated and was performed.  Urine Culture     Status: Abnormal   Collection Time: 08/21/23  2:00 PM   Specimen: Urine, Clean Catch   UR  Result Value Ref Range   Urine Culture, Routine Final report (A)    Organism ID, Bacteria Escherichia coli (A)     Comment: Cefazolin with an MIC <=16 predicts susceptibility to the oral agents cefaclor, cefdinir, cefpodoxime, cefprozil, cefuroxime, cephalexin, and loracarbef when used for therapy of uncomplicated urinary  tract infections due to E. coli, Klebsiella pneumoniae, and Proteus mirabilis. Multi-Drug Resistant Organism Greater than 100,000 colony forming units per mL    Antimicrobial Susceptibility Comment     Comment:       ** S = Susceptible; I = Intermediate; R = Resistant **                    P = Positive; N = Negative             MICS are expressed in micrograms per mL    Antibiotic                 RSLT#1    RSLT#2    RSLT#3    RSLT#4 Amoxicillin /Clavulanic Acid    S Ampicillin                     R Cefazolin                      S Cefepime                       S Cefoxitin                      S Cefpodoxime                    S Ceftriaxone                    S Ciprofloxacin                   S Ertapenem                      S Gentamicin                     S Levofloxacin                    I Meropenem                      S Nitrofurantoin                  S Piperacillin/Tazobactam        S Tetracycline  S Tobramycin                     S Trimethoprim/Sulfa             R   Microscopic Examination     Status: None   Collection Time: 08/21/23  2:00 PM  Result Value Ref Range   WBC, UA None seen 0 - 5 /hpf   RBC, Urine None seen 0 - 2 /hpf   Epithelial Cells (non renal) None seen 0 - 10 /hpf   Casts None seen None seen /lpf   Bacteria, UA None seen None seen/Few  POCT Urinalysis Dipstick ([id number])     Status: Abnormal   Collection Time: 09/10/23 10:34 AM  Result Value Ref Range   Color, UA Yellow    Clarity, UA Cloudy    Glucose, UA Negative Negative   Bilirubin, UA Negative    Ketones, UA Negative    Spec Grav, UA 1.020 1.010 - 1.025   Blood, UA Moderate    pH, UA 6.5 5.0 - 8.0   Protein, UA Positive (A) Negative   Urobilinogen, UA >=8.0 (A) 0.2 or 1.0 E.U./dL   Nitrite, UA Negative    Leukocytes, UA Large (3+) (A) Negative   Appearance Cloudy    Odor Yes   POCT Urinalysis Dipstick ([id number])     Status: Abnormal   Collection Time: 09/20/23 10:21 AM   Result Value Ref Range   Color, UA Dark yellow    Clarity, UA Cloudy    Glucose, UA Negative Negative   Bilirubin, UA Negative    Ketones, UA Negative    Spec Grav, UA 1.025 1.010 - 1.025   Blood, UA Small    pH, UA 5.5 5.0 - 8.0   Protein, UA Negative Negative   Urobilinogen, UA 1.0 0.2 or 1.0 E.U./dL   Nitrite, UA Negative    Leukocytes, UA Trace (A) Negative   Appearance Cloudy    Odor Yes   Urine Culture     Status: None   Collection Time: 09/20/23 12:08 PM   Specimen: Urine   UR  Result Value Ref Range   Urine Culture, Routine Final report    Organism ID, Bacteria Comment     Comment: Mixed urogenital flora 25,000-50,000 colony forming units per mL   POCT CBG (Fasting - Glucose)     Status: Abnormal   Collection Time: 09/23/23  1:41 PM  Result Value Ref Range   Glucose Fasting, POC 104 (A) 70 - 99 mg/dL  POCT Urinalysis Dipstick ([id number])     Status: None   Collection Time: 09/23/23  2:23 PM  Result Value Ref Range   Color, UA Yellow    Clarity, UA Clear    Glucose, UA Negative Negative   Bilirubin, UA Negative    Ketones, UA Negative    Spec Grav, UA 1.020 1.010 - 1.025   Blood, UA Trace    pH, UA 6.0 5.0 - 8.0   Protein, UA Negative Negative   Urobilinogen, UA 1.0 0.2 or 1.0 E.U./dL   Nitrite, UA Negative    Leukocytes, UA Negative Negative   Appearance Clear    Odor No   Urine Culture     Status: None   Collection Time: 09/23/23  3:49 PM   Specimen: Urine   UR  Result Value Ref Range   Urine Culture, Routine Final report    Organism ID, Bacteria Comment     Comment: Mixed urogenital flora 10,000-25,000  colony forming units per mL   Lipase     Status: Abnormal   Collection Time: 09/24/23  8:31 AM  Result Value Ref Range   Lipase 85 (H) 14 - 72 U/L  CBC with Diff     Status: Abnormal   Collection Time: 09/24/23  8:31 AM  Result Value Ref Range   WBC 5.6 3.4 - 10.8 x10E3/uL   RBC 4.37 3.77 - 5.28 x10E6/uL   Hemoglobin 13.4 11.1 - 15.9 g/dL    Hematocrit 82.9 56.2 - 46.6 %   MCV 93 79 - 97 fL   MCH 30.7 26.6 - 33.0 pg   MCHC 33.1 31.5 - 35.7 g/dL   RDW 13.0 86.5 - 78.4 %   Platelets 128 (L) 150 - 450 x10E3/uL   Neutrophils 52 Not Estab. %   Lymphs 32 Not Estab. %   Monocytes 15 Not Estab. %   Eos 1 Not Estab. %   Basos 0 Not Estab. %   Neutrophils Absolute 2.8 1.4 - 7.0 x10E3/uL   Lymphocytes Absolute 1.8 0.7 - 3.1 x10E3/uL   Monocytes Absolute 0.9 0.1 - 0.9 x10E3/uL   EOS (ABSOLUTE) 0.1 0.0 - 0.4 x10E3/uL   Basophils Absolute 0.0 0.0 - 0.2 x10E3/uL   Immature Granulocytes 0 Not Estab. %   Immature Grans (Abs) 0.0 0.0 - 0.1 x10E3/uL  Lipid Panel w/o Chol/HDL Ratio     Status: None   Collection Time: 09/24/23  8:31 AM  Result Value Ref Range   Cholesterol, Total 157 100 - 199 mg/dL   Triglycerides 86 0 - 149 mg/dL   HDL 72 >69 mg/dL   VLDL Cholesterol Cal 16 5 - 40 mg/dL   LDL Chol Calc (NIH) 69 0 - 99 mg/dL  [id number]     Status: None   Collection Time: 09/24/23  8:31 AM  Result Value Ref Range   Glucose 98 70 - 99 mg/dL   BUN 11 8 - 27 mg/dL   Creatinine, Ser 2.44 0.57 - 1.00 mg/dL   eGFR 78 >01 UU/VOZ/3.66   BUN/Creatinine Ratio 13 12 - 28   Sodium 140 134 - 144 mmol/L   Potassium 4.4 3.5 - 5.2 mmol/L   Chloride 102 96 - 106 mmol/L   CO2 24 20 - 29 mmol/L   Calcium 9.4 8.7 - 10.3 mg/dL   Total Protein 6.6 6.0 - 8.5 g/dL   Albumin 4.2 3.9 - 4.9 g/dL   Globulin, Total 2.4 1.5 - 4.5 g/dL   Bilirubin Total 0.5 0.0 - 1.2 mg/dL   Alkaline Phosphatase 83 44 - 121 IU/L   AST 21 0 - 40 IU/L   ALT 18 0 - 32 IU/L  Hemoglobin A1c     Status: Abnormal   Collection Time: 09/24/23  8:31 AM  Result Value Ref Range   Hgb A1c MFr Bld 6.2 (H) 4.8 - 5.6 %    Comment:          Prediabetes: 5.7 - 6.4          Diabetes: >6.4          Glycemic control for adults with diabetes: <7.0    Est. average glucose Bld gHb Est-mCnc 131 mg/dL    Allergies as of 4/40/3474   No Known Allergies      Medication List         Accurate as of July 19, 2023 11:59 PM. If you have any questions, ask your nurse or doctor.  acetaminophen 500 MG tablet Commonly known as: TYLENOL Take by mouth.   amLODipine  5 MG tablet Commonly known as: NORVASC  Take 5 mg by mouth daily.   amoxicillin -clavulanate 875-125 MG tablet Commonly known as: AUGMENTIN  Take 1 tablet by mouth 2 (two) times daily. Started by: Trenda Frisk   Azelastine HCl 0.15 % Soln Place into the nose.   b complex vitamins capsule Take 1 capsule by mouth daily.   cloNIDine  0.3 MG tablet Commonly known as: CATAPRES  TAKE ONE TABLET BY MOUTH AT BEDTIME   Dexcom G7 Sensor Misc Use as directed   Dexcom G7 Sensor Misc I sensor every 10 days   DULoxetine 60 MG capsule Commonly known as: CYMBALTA   glimepiride  2 MG tablet Commonly known as: AMARYL  Take 1 tablet (2 mg total) by mouth daily with breakfast.   hydrOXYzine 50 MG tablet Commonly known as: ATARAX Take 50 mg by mouth every 8 (eight) hours as needed for anxiety.   lisinopril 5 MG tablet Commonly known as: ZESTRIL   multivitamin capsule Take by mouth.   QUEtiapine 50 MG tablet Commonly known as: SEROQUEL Take 25 mg by mouth at bedtime.   simvastatin  20 MG tablet Commonly known as: ZOCOR  Take 1 tablet (20 mg total) by mouth daily.   Trulicity  4.5 MG/0.5ML Soaj Generic drug: Dulaglutide  Inject 4.5 mg into the skin once a week.   Turmeric 400 MG Caps Take by mouth.   vitamin B-12 250 MCG tablet Commonly known as: CYANOCOBALAMIN  Take 250 mcg by mouth in the morning, at noon, and at bedtime.   Vitamin D3 50 MCG (2000 UT) capsule Take 2,000 Units by mouth daily.   Zoloft 100 MG tablet Generic drug: sertraline Take 100 mg by mouth daily.            Assessment & Plan:   Problem List Items Addressed This Visit   None Visit Diagnoses       Acute upper respiratory infection    -  Primary   Relevant Orders   POC Influenza A&B (Binax test)  (Completed)     COVID/Flu/RSV test in office today WNL. Sending antibiotics for patient.    Return as previously scheduled unless not improving.  Total time spent: 20 minutes  Trenda Frisk, FNP  07/19/2023   This document may have been prepared by [facility] Voice Recognition software and as such may include unintentional dictation errors.

## 2023-07-30 DIAGNOSIS — S52514A Nondisplaced fracture of right radial styloid process, initial encounter for closed fracture: Secondary | ICD-10-CM | POA: Diagnosis not present

## 2023-08-06 DIAGNOSIS — G4733 Obstructive sleep apnea (adult) (pediatric): Secondary | ICD-10-CM | POA: Diagnosis not present

## 2023-08-06 DIAGNOSIS — S52514A Nondisplaced fracture of right radial styloid process, initial encounter for closed fracture: Secondary | ICD-10-CM | POA: Diagnosis not present

## 2023-08-08 ENCOUNTER — Other Ambulatory Visit: Payer: Self-pay

## 2023-08-08 MED ORDER — AMLODIPINE BESYLATE 5 MG PO TABS: 5.0000 mg | ORAL_TABLET | Freq: Every day | ORAL | 3 refills | Status: AC

## 2023-08-12 ENCOUNTER — Encounter: Payer: Self-pay | Admitting: Internal Medicine

## 2023-08-13 ENCOUNTER — Other Ambulatory Visit: Payer: Self-pay | Admitting: Internal Medicine

## 2023-08-13 DIAGNOSIS — N959 Unspecified menopausal and perimenopausal disorder: Secondary | ICD-10-CM

## 2023-08-13 MED ORDER — CLONIDINE HCL 0.3 MG PO TABS: 0.3000 mg | ORAL_TABLET | Freq: Every day | ORAL | 3 refills | Status: DC

## 2023-08-21 ENCOUNTER — Ambulatory Visit (INDEPENDENT_AMBULATORY_CARE_PROVIDER_SITE_OTHER): Admitting: Family

## 2023-08-21 DIAGNOSIS — N390 Urinary tract infection, site not specified: Secondary | ICD-10-CM | POA: Diagnosis not present

## 2023-08-21 LAB — POCT URINALYSIS DIPSTICK
Glucose, UA: NEGATIVE
Ketones, UA: NEGATIVE
Nitrite, UA: NEGATIVE
Protein, UA: POSITIVE — AB
Spec Grav, UA: 1.02 (ref 1.010–1.025)
Urobilinogen, UA: 1 U/dL
pH, UA: 6 (ref 5.0–8.0)

## 2023-08-21 MED ORDER — NITROFURANTOIN MONOHYD MACRO 100 MG PO CAPS: 100.0000 mg | ORAL_CAPSULE | Freq: Two times a day (BID) | ORAL | 0 refills | Status: DC

## 2023-08-21 NOTE — Progress Notes (Signed)
   CHIEF COMPLAINT  UA/ only visit fot UTI     REASON FOR VISIT  Possible UTI, UA Visit Only      ASSESSMENT & PLAN Diagnoses and all orders for this visit:  Urinary tract infection without hematuria, site unspecified -     POCT Urinalysis Dipstick ([id number]) -     Urinalysis, Routine w reflex microscopic -     Urine Culture  Other orders -     nitrofurantoin, macrocrystal-monohydrate, (MACROBID) 100 MG capsule; Take 1 capsule (100 mg total) by mouth 2 (two) times daily.     Patient notified.  Total time spent: 5 minutes  Miki Kins, FNP 08/21/2023

## 2023-08-22 LAB — URINALYSIS, ROUTINE W REFLEX MICROSCOPIC
Glucose, UA: NEGATIVE
Ketones, UA: NEGATIVE
Nitrite, UA: POSITIVE — AB
Specific Gravity, UA: 1.018 (ref 1.005–1.030)
Urobilinogen, Ur: 1 mg/dL (ref 0.2–1.0)
pH, UA: 6.5 (ref 5.0–7.5)

## 2023-08-22 LAB — MICROSCOPIC EXAMINATION
Bacteria, UA: NONE SEEN
Casts: NONE SEEN /LPF
Epithelial Cells (non renal): NONE SEEN /HPF (ref 0–10)
RBC, Urine: NONE SEEN /HPF (ref 0–2)
WBC, UA: NONE SEEN /HPF (ref 0–5)

## 2023-08-24 ENCOUNTER — Encounter: Payer: Self-pay | Admitting: Family

## 2023-08-24 LAB — URINE CULTURE

## 2023-08-24 MED ORDER — CIPROFLOXACIN HCL 500 MG PO TABS: 500.0000 mg | ORAL_TABLET | Freq: Two times a day (BID) | ORAL | 0 refills | Status: AC

## 2023-08-24 NOTE — Addendum Note (Signed)
 Addended by: Grayling Congress on: 08/24/2023 06:24 PM   Modules accepted: Orders

## 2023-08-27 DIAGNOSIS — S52514D Nondisplaced fracture of right radial styloid process, subsequent encounter for closed fracture with routine healing: Secondary | ICD-10-CM | POA: Diagnosis not present

## 2023-09-02 ENCOUNTER — Encounter: Payer: Self-pay | Admitting: Dermatology

## 2023-09-02 ENCOUNTER — Ambulatory Visit: Payer: 59 | Admitting: Dermatology

## 2023-09-02 DIAGNOSIS — L304 Erythema intertrigo: Secondary | ICD-10-CM | POA: Diagnosis not present

## 2023-09-02 DIAGNOSIS — F411 Generalized anxiety disorder: Secondary | ICD-10-CM | POA: Diagnosis not present

## 2023-09-02 DIAGNOSIS — Z1283 Encounter for screening for malignant neoplasm of skin: Secondary | ICD-10-CM | POA: Diagnosis not present

## 2023-09-02 DIAGNOSIS — F39 Unspecified mood [affective] disorder: Secondary | ICD-10-CM | POA: Diagnosis not present

## 2023-09-02 DIAGNOSIS — W908XXA Exposure to other nonionizing radiation, initial encounter: Secondary | ICD-10-CM

## 2023-09-02 DIAGNOSIS — L821 Other seborrheic keratosis: Secondary | ICD-10-CM | POA: Diagnosis not present

## 2023-09-02 DIAGNOSIS — F5105 Insomnia due to other mental disorder: Secondary | ICD-10-CM | POA: Diagnosis not present

## 2023-09-02 DIAGNOSIS — D229 Melanocytic nevi, unspecified: Secondary | ICD-10-CM

## 2023-09-02 DIAGNOSIS — Z85828 Personal history of other malignant neoplasm of skin: Secondary | ICD-10-CM | POA: Diagnosis not present

## 2023-09-02 DIAGNOSIS — L578 Other skin changes due to chronic exposure to nonionizing radiation: Secondary | ICD-10-CM | POA: Diagnosis not present

## 2023-09-02 DIAGNOSIS — D1801 Hemangioma of skin and subcutaneous tissue: Secondary | ICD-10-CM | POA: Diagnosis not present

## 2023-09-02 DIAGNOSIS — L940 Localized scleroderma [morphea]: Secondary | ICD-10-CM

## 2023-09-02 DIAGNOSIS — L814 Other melanin hyperpigmentation: Secondary | ICD-10-CM | POA: Diagnosis not present

## 2023-09-02 DIAGNOSIS — L820 Inflamed seborrheic keratosis: Secondary | ICD-10-CM

## 2023-09-02 DIAGNOSIS — F4312 Post-traumatic stress disorder, chronic: Secondary | ICD-10-CM | POA: Diagnosis not present

## 2023-09-02 MED ORDER — PIMECROLIMUS 1 % EX CREA: TOPICAL_CREAM | CUTANEOUS | 6 refills | Status: DC

## 2023-09-02 MED ORDER — KETOCONAZOLE 2 % EX CREA: 1.0000 | TOPICAL_CREAM | CUTANEOUS | 0 refills | Status: AC

## 2023-09-02 NOTE — Progress Notes (Signed)
 Follow-Up Visit   Subjective  Sandra Le is a 63 y.o. female who presents for the following: Skin Cancer Screening and Full Body Skin Exam, hx of BCC, rash lower abdomen, using otc HC, check spot upper lip, scabs over, comes and goes  The patient presents for Total-Body Skin Exam (TBSE) for skin cancer screening and mole check. The patient has spots, moles and lesions to be evaluated, some may be new or changing and the patient may have concern these could be cancer.    The following portions of the chart were reviewed this encounter and updated as appropriate: medications, allergies, medical history  Review of Systems:  No other skin or systemic complaints except as noted in HPI or Assessment and Plan.  Objective  Well appearing patient in no apparent distress; mood and affect are within normal limits.  A full examination was performed including scalp, head, eyes, ears, nose, lips, neck, chest, axillae, abdomen, back, buttocks, bilateral upper extremities, bilateral lower extremities, hands, feet, fingers, toes, fingernails, and toenails. All findings within normal limits unless otherwise noted below.   Relevant physical exam findings are noted in the Assessment and Plan.  L upper lip x 1 Stuck on waxy papule with erythema     Assessment & Plan   SKIN CANCER SCREENING PERFORMED TODAY.  ACTINIC DAMAGE arms - Chronic condition, secondary to cumulative UV/sun exposure - diffuse scaly erythematous macules with underlying dyspigmentation - Recommend daily broad spectrum sunscreen SPF 30+ to sun-exposed areas, reapply every 2 hours as needed.  - Staying in the shade or wearing long sleeves, sun glasses (UVA+UVB protection) and wide brim hats (4-inch brim around the entire circumference of the hat) are also recommended for sun protection.  - Call for new or changing lesions.  LENTIGINES, SEBORRHEIC KERATOSES, HEMANGIOMAS - Benign normal skin lesions - Benign-appearing -  Call for any changes  MELANOCYTIC NEVI - Tan-brown and/or pink-flesh-colored symmetric macules and papules - Benign appearing on exam today - Observation - Call clinic for new or changing moles - Recommend daily use of broad spectrum spf 30+ sunscreen to sun-exposed areas.   INTERTRIGO Lower abdomen Exam: Erythema lower abdomen at fold, pink scaly patches  Chronic and persistent condition with duration or expected duration over one year. Condition is bothersome/symptomatic for patient. Currently flared.   Intertrigo is a chronic recurrent rash that occurs in skin fold areas that may be associated with friction; heat; moisture; yeast; fungus; and bacteria.  It is exacerbated by increased movement / activity; sweating; and higher atmospheric temperature.  Use of an absorbant powder such as Zeasorb AF powder or other OTC antifungal powder to the area daily can prevent rash recurrence. Other options to help keep the area dry include blow drying the area after bathing or using antiperspirant products such as Duradry sweat minimizing gel.  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 Condition is not bothersome to patient. Not currently at goal, but seems to be stable.   Treatment Plan: Localized to skin. Stable. Not bothersome. Observe  HISTORY OF BASAL CELL CARCINOMA OF THE SKIN - No evidence of recurrence today - Recommend regular full body skin exams - Recommend daily broad spectrum sunscreen SPF 30+  to sun-exposed areas, reapply every 2 hours as needed.  - Call if any new or changing lesions are noted between office visits  - L shoulder INFLAMED SEBORRHEIC KERATOSIS L upper lip x  1 Symptomatic, irritating, patient would like treated. Destruction of lesion - L upper lip x 1  Destruction method: cryotherapy   Informed consent: discussed and consent obtained   Lesion destroyed using liquid nitrogen: Yes   Region frozen until ice ball extended beyond lesion: Yes   Outcome: patient tolerated procedure well with no complications   Post-procedure details: wound care instructions given   Additional details:  Prior to procedure, discussed risks of blister formation, small wound, skin dyspigmentation, or rare scar following cryotherapy. Recommend Vaseline ointment to treated areas while healing.  Return in about 1 year (around 09/01/2024) for TBSE, Hx of BCC.  I, Rollie Clipper, RMA, am acting as scribe for Artemio Larry, MD .   Documentation: I have reviewed the above documentation for accuracy and completeness, and I agree with the above.  Artemio Larry, MD

## 2023-09-02 NOTE — Patient Instructions (Addendum)
 Intertrigo lower abdomen Start Ketoconazole 2% cream one to two times daily Start Elidel cream one to two times a day until clear then as needed for flares Start Zeasorb AF powder daily  Cryotherapy Aftercare  Wash gently with soap and water everyday.   Apply Vaseline and Band-Aid daily until healed.    Due to recent changes in healthcare laws, you may see results of your pathology and/or laboratory studies on MyChart before the doctors have had a chance to review them. We understand that in some cases there may be results that are confusing or concerning to you. Please understand that not all results are received at the same time and often the doctors may need to interpret multiple results in order to provide you with the best plan of care or course of treatment. Therefore, we ask that you please give [place] 2 business days to thoroughly review all your results before contacting the office for clarification. Should we see a critical lab result, you will be contacted sooner.   If You Need Anything After Your Visit  If you have any questions or concerns for your doctor, please call our main line at [phone] and press option 4 to reach your doctor's medical assistant. If no one answers, please leave a voicemail as directed and we will return your call as soon as possible. Messages left after 4 pm will be answered the following business day.   You may also send [place] a message via MyChart. We typically respond to MyChart messages within 1-2 business days.  For prescription refills, please ask your pharmacy to contact our office. Our fax number is [phone].  If you have an urgent issue when the clinic is closed that cannot wait until the next business day, you can page your doctor at the number below.    Please note that while we do our best to be available for urgent issues outside of office hours, we are not available 24/7.   If you have an urgent issue and are unable to reach [place], you may  choose to seek medical care at your doctor's office, retail clinic, urgent care center, or emergency room.  If you have a medical emergency, please immediately call 911 or go to the emergency department.  Pager Numbers  - Dr. Gwen Pounds: [phone]  - Dr. Roseanne Reno: [phone]  - Dr. Katrinka Blazing: [phone]   In the event of inclement weather, please call our main line at [phone] for an update on the status of any delays or closures.  Dermatology Medication Tips: Please keep the boxes that topical medications come in in order to help keep track of the instructions about where and how to use these. Pharmacies typically print the medication instructions only on the boxes and not directly on the medication tubes.   If your medication is too expensive, please contact our office at [phone] option 4 or send [place] a message through MyChart.   We are unable to tell what your co-pay for medications will be in advance as this is different depending on your insurance coverage. However, we may be able to find a substitute medication at lower cost or fill out paperwork to get insurance to cover a needed medication.   If a prior authorization is required to get your medication covered by your insurance company, please allow [place] 1-2 business days to complete this process.  Drug prices often vary depending on where the prescription is filled and some pharmacies may offer cheaper prices.  The website www.goodrx.com  contains coupons for medications through different pharmacies. The prices here do not account for what the cost may be with help from insurance (it may be cheaper with your insurance), but the website can give you the price if you did not use any insurance.  - You can print the associated coupon and take it with your prescription to the pharmacy.  - You may also stop by our office during regular business hours and pick up a GoodRx coupon card.  - If you need your prescription sent  electronically to a different pharmacy, notify our office through [facility] or by phone at [phone] option 4.     Si Usted Necesita Algo Despus de Su Visita  Tambin puede enviarnos un mensaje a travs de Clinical cytogeneticist. Por lo general respondemos a los mensajes de MyChart en el transcurso de 1 a 2 das hbiles.  Para renovar recetas, por favor pida a su farmacia que se ponga en contacto con nuestra oficina. Annie Sable de fax es [place] [phone].  Si tiene un asunto urgente cuando la clnica est cerrada y que no puede esperar hasta el siguiente da hbil, puede llamar/localizar a su doctor(a) al nmero que aparece a continuacin.   Por favor, tenga en cuenta que aunque hacemos todo lo posible para estar disponibles para asuntos urgentes fuera del horario de [place], no estamos disponibles las 24 horas del da, los 7 [address]  [address] de la [place].   Si tiene un problema urgente y no puede comunicarse con nosotros, puede optar por buscar atencin mdica  en el consultorio de su doctor(a), en una clnica privada, en un centro de atencin urgente o en una sala de emergencias.  Si tiene Engineer, drilling, por favor llame inmediatamente al 911 o vaya a la sala de emergencias.  Nmeros de bper  - Dr. Gwen Pounds: [phone]  - Dra. Roseanne Reno: [phone]  - Dr. Katrinka Blazing: [phone]   En caso de inclemencias del tiempo, por favor llame a Lacy Duverney principal al [phone] para una actualizacin sobre el [place] de cualquier retraso o cierre.  Consejos para la medicacin en dermatologa: Por favor, guarde las cajas en las que vienen los medicamentos de uso tpico para ayudarle a seguir las instrucciones sobre dnde y cmo usarlos. Las farmacias generalmente imprimen las instrucciones del medicamento slo en las cajas y no directamente en los tubos del [place].   Si su medicamento es muy caro, por favor, pngase en contacto con Rolm Gala llamando al [phone] y presione la  opcin 4 o envenos un mensaje a travs de Clinical cytogeneticist.   No podemos decirle cul ser su copago por los medicamentos por adelantado ya que esto es diferente dependiendo de la cobertura de su seguro. Sin embargo, es posible que podamos encontrar un medicamento sustituto a Audiological scientist un formulario para que el seguro cubra el medicamento que se considera necesario.   Si se requiere una autorizacin previa para que su compaa de seguros [place] su medicamento, por favor permtanos de 1 a 2 das hbiles para completar [address]  Los precios de los medicamentos varan con frecuencia dependiendo del Environmental consultant de dnde se surte la receta y alguna farmacias pueden ofrecer precios ms baratos.  El sitio web www.goodrx.com tiene cupones para medicamentos de Health and safety inspector. Los precios aqu no tienen en cuenta lo que podra costar con la ayuda del seguro (puede ser ms barato con su seguro), pero el sitio web puede darle el precio si no utiliz Tourist information centre manager.  - Puede imprimir  el cupn correspondiente y llevarlo con su receta a la farmacia.  - Tambin puede pasar por nuestra oficina durante el horario de atencin regular y Education officer, museum una tarjeta de cupones de GoodRx.  - Si necesita que su receta se enve electrnicamente a una farmacia diferente, informe a nuestra oficina a travs de MyChart de [place] o por telfono llamando al [phone] y presione la opcin 4.

## 2023-09-03 ENCOUNTER — Telehealth: Payer: Self-pay

## 2023-09-03 NOTE — Telephone Encounter (Signed)
 [facility] denied Elidel cream - covered for eczema, psoriasis, and vitiligo, not intertrigo. Please advise.

## 2023-09-10 ENCOUNTER — Encounter: Payer: Self-pay | Admitting: Internal Medicine

## 2023-09-10 ENCOUNTER — Ambulatory Visit (INDEPENDENT_AMBULATORY_CARE_PROVIDER_SITE_OTHER): Admitting: Internal Medicine

## 2023-09-10 DIAGNOSIS — N3000 Acute cystitis without hematuria: Secondary | ICD-10-CM | POA: Diagnosis not present

## 2023-09-10 DIAGNOSIS — I152 Hypertension secondary to endocrine disorders: Secondary | ICD-10-CM

## 2023-09-10 DIAGNOSIS — E1159 Type 2 diabetes mellitus with other circulatory complications: Secondary | ICD-10-CM

## 2023-09-10 DIAGNOSIS — R300 Dysuria: Secondary | ICD-10-CM

## 2023-09-10 LAB — POCT URINALYSIS DIPSTICK
Bilirubin, UA: NEGATIVE
Glucose, UA: NEGATIVE
Ketones, UA: NEGATIVE
Nitrite, UA: NEGATIVE
Protein, UA: POSITIVE — AB
Spec Grav, UA: 1.02 (ref 1.010–1.025)
Urobilinogen, UA: >=8 U/dL — AB
pH, UA: 6.5 (ref 5.0–8.0)

## 2023-09-10 MED ORDER — CIPROFLOXACIN HCL 500 MG PO TABS: 500.0000 mg | ORAL_TABLET | Freq: Two times a day (BID) | ORAL | 0 refills | Status: AC

## 2023-09-10 NOTE — Progress Notes (Signed)
 Patient c/o dysuria- Urine dipstick shows large pus cells- Send for c/s. Empirically start po Cipro -

## 2023-09-11 ENCOUNTER — Encounter: Payer: Self-pay | Admitting: Dermatology

## 2023-09-11 NOTE — Progress Notes (Signed)
 Patient notified

## 2023-09-11 NOTE — Telephone Encounter (Signed)
 I spoke with patient and she is coming to office to sign AOR release form so I can start Elidel  PA.

## 2023-09-17 ENCOUNTER — Other Ambulatory Visit: Payer: Self-pay | Admitting: Internal Medicine

## 2023-09-17 DIAGNOSIS — E1169 Type 2 diabetes mellitus with other specified complication: Secondary | ICD-10-CM

## 2023-09-19 ENCOUNTER — Encounter: Payer: Self-pay | Admitting: Internal Medicine

## 2023-09-20 ENCOUNTER — Ambulatory Visit (INDEPENDENT_AMBULATORY_CARE_PROVIDER_SITE_OTHER): Admitting: Internal Medicine

## 2023-09-20 ENCOUNTER — Encounter: Payer: Self-pay | Admitting: Internal Medicine

## 2023-09-20 DIAGNOSIS — N390 Urinary tract infection, site not specified: Secondary | ICD-10-CM

## 2023-09-20 DIAGNOSIS — R300 Dysuria: Secondary | ICD-10-CM | POA: Diagnosis not present

## 2023-09-20 DIAGNOSIS — F39 Unspecified mood [affective] disorder: Secondary | ICD-10-CM | POA: Diagnosis not present

## 2023-09-20 DIAGNOSIS — F5105 Insomnia due to other mental disorder: Secondary | ICD-10-CM | POA: Diagnosis not present

## 2023-09-20 DIAGNOSIS — F411 Generalized anxiety disorder: Secondary | ICD-10-CM | POA: Diagnosis not present

## 2023-09-20 DIAGNOSIS — F4312 Post-traumatic stress disorder, chronic: Secondary | ICD-10-CM | POA: Diagnosis not present

## 2023-09-20 LAB — POCT URINALYSIS DIPSTICK
Bilirubin, UA: NEGATIVE
Glucose, UA: NEGATIVE
Ketones, UA: NEGATIVE
Nitrite, UA: NEGATIVE
Protein, UA: NEGATIVE
Spec Grav, UA: 1.025 (ref 1.010–1.025)
Urobilinogen, UA: 1 U/dL
pH, UA: 5.5 (ref 5.0–8.0)

## 2023-09-20 NOTE — Progress Notes (Signed)
 Complete abx- still has mild dysuria. Send urine for culture. Urology referral for Recurrent UTI.

## 2023-09-22 ENCOUNTER — Encounter: Payer: Self-pay | Admitting: Internal Medicine

## 2023-09-22 LAB — URINE CULTURE

## 2023-09-23 ENCOUNTER — Ambulatory Visit (INDEPENDENT_AMBULATORY_CARE_PROVIDER_SITE_OTHER): Admitting: Internal Medicine

## 2023-09-23 ENCOUNTER — Encounter: Payer: Self-pay | Admitting: Internal Medicine

## 2023-09-23 VITALS — BP 126/84 | HR 73 | Ht 65.0 in | Wt 209.6 lb

## 2023-09-23 DIAGNOSIS — I152 Hypertension secondary to endocrine disorders: Secondary | ICD-10-CM

## 2023-09-23 DIAGNOSIS — E1159 Type 2 diabetes mellitus with other circulatory complications: Secondary | ICD-10-CM

## 2023-09-23 DIAGNOSIS — R1010 Upper abdominal pain, unspecified: Secondary | ICD-10-CM

## 2023-09-23 DIAGNOSIS — E1169 Type 2 diabetes mellitus with other specified complication: Secondary | ICD-10-CM | POA: Diagnosis not present

## 2023-09-23 DIAGNOSIS — R1084 Generalized abdominal pain: Secondary | ICD-10-CM | POA: Diagnosis not present

## 2023-09-23 DIAGNOSIS — E782 Mixed hyperlipidemia: Secondary | ICD-10-CM

## 2023-09-23 DIAGNOSIS — N390 Urinary tract infection, site not specified: Secondary | ICD-10-CM | POA: Diagnosis not present

## 2023-09-23 DIAGNOSIS — N3000 Acute cystitis without hematuria: Secondary | ICD-10-CM | POA: Diagnosis not present

## 2023-09-23 DIAGNOSIS — E1165 Type 2 diabetes mellitus with hyperglycemia: Secondary | ICD-10-CM

## 2023-09-23 DIAGNOSIS — F411 Generalized anxiety disorder: Secondary | ICD-10-CM | POA: Insufficient documentation

## 2023-09-23 LAB — POCT URINALYSIS DIPSTICK
Bilirubin, UA: NEGATIVE
Glucose, UA: NEGATIVE
Ketones, UA: NEGATIVE
Leukocytes, UA: NEGATIVE
Nitrite, UA: NEGATIVE
Protein, UA: NEGATIVE
Spec Grav, UA: 1.02 (ref 1.010–1.025)
Urobilinogen, UA: 1 U/dL
pH, UA: 6 (ref 5.0–8.0)

## 2023-09-23 LAB — POCT CBG (FASTING - GLUCOSE)-MANUAL ENTRY: Glucose Fasting, POC: 104 mg/dL — AB (ref 70–99)

## 2023-09-23 MED ORDER — TRULICITY 3 MG/0.5ML ~~LOC~~ SOAJ: 3.0000 mg | SUBCUTANEOUS | 2 refills | Status: DC

## 2023-09-23 NOTE — Progress Notes (Signed)
 Established Patient Office Visit  Subjective:  Patient ID: Sandra Le, female    DOB: [birth year]  Age: 63 y.o. MRN: [id number]  Chief Complaint  Patient presents with   Abdominal Pain    Patient comes in with complaints of abdominal pain.  She is also concerned about her recurrent UTIs and requests urology referral.  She is reporting crampy abdominal pain with some nausea and belching.  She is constipated.  No fevers or chills but she is currently on Trulicity  4.5 mg/week.  Patient has history of gastric bypass as well.  Her last colonoscopy was about 10 years ago. Will check her labs and schedule a CT of the abdomen. Send referrals to urologist and GI. Also will reduce her Trulicity  to 3 mg/week.    No other concerns at this time.   Past Medical History:  Diagnosis Date   Basal cell carcinoma 2024   L shoulder, txted at [facility]   Hyperlipidemia    Hypertension     Past Surgical History:  Procedure Laterality Date   BARIATRIC SURGERY     CESAREAN SECTION      Social History   Socioeconomic History   Marital status: Married    Spouse name: Not on file   Number of children: Not on file   Years of education: Not on file   Highest education level: Not on file  Occupational History   Not on file  Tobacco Use   Smoking status: Former   Smokeless tobacco: Never  Vaping Use   Vaping status: Never Used  Substance and Sexual Activity   Alcohol use: Yes   Drug use: Not on file   Sexual activity: Not on file  Other Topics Concern   Not on file  Social History Narrative   Not on file   Social Drivers of Health   Financial Resource Strain: Not on file  Food Insecurity: Not on file  Transportation Needs: Not on file  Physical Activity: Not on file  Stress: Not on file  Social Connections: Not on file  Intimate Partner Violence: Not on file    Family History  Problem Relation Age of Onset   Breast cancer Neg Hx    Bladder Cancer Neg Hx    Kidney  cancer Neg Hx     No Known Allergies  Outpatient Medications Prior to Visit  Medication Sig   acetaminophen (TYLENOL) 500 MG tablet Take by mouth.   amLODipine  (NORVASC ) 5 MG tablet Take 1 tablet (5 mg total) by mouth daily.   Azelastine HCl 0.15 % SOLN Place into the nose.   b complex vitamins capsule Take 1 capsule by mouth daily.   Cholecalciferol (VITAMIN D3) 50 MCG (2000 UT) capsule Take 2,000 Units by mouth daily.   cloNIDine  (CATAPRES ) 0.3 MG tablet Take 1 tablet (0.3 mg total) by mouth at bedtime.   Continuous Glucose Sensor (DEXCOM G7 SENSOR) MISC Use as directed   Continuous Glucose Sensor (DEXCOM G7 SENSOR) MISC I sensor every 10 days   DULoxetine (CYMBALTA) 60 MG capsule    glimepiride  (AMARYL ) 2 MG tablet Take 1 tablet (2 mg total) by mouth daily with breakfast.   hydrOXYzine (ATARAX) 50 MG tablet Take 50 mg by mouth every 8 (eight) hours as needed for anxiety.   ketoconazole  (NIZORAL ) 2 % cream Apply 1 Application topically as directed. qd to bid to itchy rash lower abdomen, under breast   lisinopril (PRINIVIL,ZESTRIL) 5 MG tablet    Multiple Vitamin (MULTIVITAMIN) capsule  Take by mouth.   QUEtiapine (SEROQUEL) 50 MG tablet Take 25 mg by mouth at bedtime.   sertraline (ZOLOFT) 100 MG tablet Take 100 mg by mouth daily.   simvastatin  (ZOCOR ) 20 MG tablet TAKE ONE TABLET BY MOUTH EVERY DAY   Turmeric 400 MG CAPS Take by mouth.   vitamin B-12 (CYANOCOBALAMIN ) 250 MCG tablet Take 250 mcg by mouth in the morning, at noon, and at bedtime.   ziprasidone (GEODON) 20 MG capsule Take 20 mg by mouth daily.   [DISCONTINUED] Dulaglutide  (TRULICITY ) 4.5 MG/0.5ML SOAJ Inject 4.5 mg into the skin once a week.   pimecrolimus  (ELIDEL ) 1 % cream Apply topically as directed. qd to bid to aa itchy rash lower abdomen, under breast prn flares (Patient not taking: Reported on 09/23/2023)   [DISCONTINUED] amoxicillin -clavulanate (AUGMENTIN ) 875-125 MG tablet Take 1 tablet by mouth 2 (two) times daily.  (Patient not taking: Reported on 09/23/2023)   [DISCONTINUED] nitrofurantoin , macrocrystal-monohydrate, (MACROBID ) 100 MG capsule Take 1 capsule (100 mg total) by mouth 2 (two) times daily. (Patient not taking: Reported on 09/23/2023)   No facility-administered medications prior to visit.    Review of Systems  Constitutional:  Positive for malaise/fatigue. Negative for chills and fever.  HENT: Negative.  Negative for sore throat.   Eyes: Negative.   Respiratory: Negative.  Negative for cough and shortness of breath.   Cardiovascular: Negative.  Negative for chest pain, palpitations and leg swelling.  Gastrointestinal:  Positive for abdominal pain and nausea. Negative for blood in stool, constipation, diarrhea, heartburn, melena and vomiting.  Genitourinary: Negative.  Negative for dysuria and flank pain.  Musculoskeletal: Negative.  Negative for joint pain and myalgias.  Skin: Negative.   Neurological: Negative.  Negative for dizziness and headaches.  Endo/Heme/Allergies: Negative.   Psychiatric/Behavioral:  Negative for depression and suicidal ideas. The patient is nervous/anxious.        Objective:   BP 126/84   Pulse 73   Ht 5\' 5"  (1.651 m)   Wt 209 lb 9.6 oz (95.1 kg)   SpO2 97%   BMI 34.88 kg/m   Vitals:   09/23/23 1333  BP: 126/84  Pulse: 73  Height: 5\' 5"  (1.651 m)  Weight: 209 lb 9.6 oz (95.1 kg)  SpO2: 97%  BMI (Calculated): 34.88    Physical Exam Vitals and nursing note reviewed.  Constitutional:      Appearance: Normal appearance.  HENT:     Head: Normocephalic and atraumatic.     Nose: Nose normal.     Mouth/Throat:     Mouth: Mucous membranes are moist.     Pharynx: Oropharynx is clear.  Eyes:     Conjunctiva/sclera: Conjunctivae normal.     Pupils: Pupils are equal, round, and reactive to light.  Cardiovascular:     Rate and Rhythm: Normal rate and regular rhythm.     Pulses: Normal pulses.     Heart sounds: Normal heart sounds. No murmur  heard. Pulmonary:     Effort: Pulmonary effort is normal.     Breath sounds: Normal breath sounds. No wheezing.  Abdominal:     General: Bowel sounds are normal.     Palpations: Abdomen is soft.     Tenderness: There is no abdominal tenderness. There is no right CVA tenderness or left CVA tenderness.  Musculoskeletal:        General: Normal range of motion.     Cervical back: Normal range of motion.     Right lower leg: No edema.  Left lower leg: No edema.  Skin:    General: Skin is warm and dry.  Neurological:     General: No focal deficit present.     Mental Status: She is alert and oriented to person, place, and time.  Psychiatric:        Mood and Affect: Mood normal.        Behavior: Behavior normal.      Results for orders placed or performed in visit on 09/23/23  POCT CBG (Fasting - Glucose)  Result Value Ref Range   Glucose Fasting, POC 104 (A) 70 - 99 mg/dL  POCT Urinalysis Dipstick (81002)  Result Value Ref Range   Color, UA Yellow    Clarity, UA Clear    Glucose, UA Negative Negative   Bilirubin, UA Negative    Ketones, UA Negative    Spec Grav, UA 1.020 1.010 - 1.025   Blood, UA Trace    pH, UA 6.0 5.0 - 8.0   Protein, UA Negative Negative   Urobilinogen, UA 1.0 0.2 or 1.0 E.U./dL   Nitrite, UA Negative    Leukocytes, UA Negative Negative   Appearance Clear    Odor No     Recent Results (from the past 2160 hours)  Hemoglobin A1c     Status: Abnormal   Collection Time: 06/28/23  9:47 AM  Result Value Ref Range   Hgb A1c MFr Bld 6.3 (H) 4.8 - 5.6 %    Comment:          Prediabetes: 5.7 - 6.4          Diabetes: >6.4          Glycemic control for adults with diabetes: <7.0    Est. average glucose Bld gHb Est-mCnc 134 mg/dL  TSH     Status: None   Collection Time: 06/28/23  9:47 AM  Result Value Ref Range   TSH 2.380 0.450 - 4.500 uIU/mL  CMP14+EGFR     Status: Abnormal   Collection Time: 06/28/23  9:47 AM  Result Value Ref Range   Glucose 103  (H) 70 - 99 mg/dL   BUN 12 8 - 27 mg/dL   Creatinine, Ser 9.56 0.57 - 1.00 mg/dL   eGFR 71 >21 HY/QMV/7.84   BUN/Creatinine Ratio 13 12 - 28   Sodium 143 134 - 144 mmol/L   Potassium 4.6 3.5 - 5.2 mmol/L   Chloride 103 96 - 106 mmol/L   CO2 24 20 - 29 mmol/L   Calcium 9.5 8.7 - 10.3 mg/dL   Total Protein 6.7 6.0 - 8.5 g/dL   Albumin 4.2 3.9 - 4.9 g/dL   Globulin, Total 2.5 1.5 - 4.5 g/dL   Bilirubin Total 0.6 0.0 - 1.2 mg/dL   Alkaline Phosphatase 76 44 - 121 IU/L   AST 27 0 - 40 IU/L   ALT 18 0 - 32 IU/L  Lipid panel     Status: None   Collection Time: 06/28/23  9:47 AM  Result Value Ref Range   Cholesterol, Total 157 100 - 199 mg/dL   Triglycerides 93 0 - 149 mg/dL   HDL 74 >69 mg/dL   VLDL Cholesterol Cal 17 5 - 40 mg/dL   LDL Chol Calc (NIH) 66 0 - 99 mg/dL   Chol/HDL Ratio 2.1 0.0 - 4.4 ratio    Comment:  T. Chol/HDL Ratio                                             Men  Women                               1/2 Avg.Risk  3.4    3.3                                   Avg.Risk  5.0    4.4                                2X Avg.Risk  9.6    7.1                                3X Avg.Risk 23.4   11.0   POCT CBG (Fasting - Glucose)     Status: Abnormal   Collection Time: 07/01/23 11:05 AM  Result Value Ref Range   Glucose Fasting, POC 138 (A) 70 - 99 mg/dL  POC Influenza A&B (Binax test)     Status: None   Collection Time: 07/19/23  3:39 PM  Result Value Ref Range   Influenza A, POC Negative Negative   Influenza B, POC Negative Negative  POCT Urinalysis Dipstick ([id number])     Status: Abnormal   Collection Time: 08/21/23 11:16 AM  Result Value Ref Range   Color, UA YELLOW    Clarity, UA CLEAR    Glucose, UA Negative Negative   Bilirubin, UA 1+    Ketones, UA NEGATIVE    Spec Grav, UA 1.020 1.010 - 1.025   Blood, UA 3+    pH, UA 6.0 5.0 - 8.0   Protein, UA Positive (A) Negative   Urobilinogen, UA 1.0 0.2 or 1.0 E.U./dL   Nitrite, UA  NEGATIVE    Leukocytes, UA Large (3+) (A) Negative   Appearance CLOUDY    Odor YES   Urinalysis, Routine w reflex microscopic     Status: Abnormal   Collection Time: 08/21/23  2:00 PM  Result Value Ref Range   Specific Gravity, UA 1.018 1.005 - 1.030   pH, UA 6.5 5.0 - 7.5   Color, UA Yellow Yellow   Appearance Ur Cloudy (A) Clear   Leukocytes,UA 3+ (A) Negative   Protein,UA 3+ (A) Negative/Trace   Glucose, UA Negative Negative   Ketones, UA Negative Negative   RBC, UA 2+ (A) Negative   Bilirubin, UA CANCELED     Comment: Test not performed. Unable to perform test due to current unavailability of reagents.  Result canceled by the ancillary.    Urobilinogen, Ur 1.0 0.2 - 1.0 mg/dL   Nitrite, UA Positive (A) Negative   Microscopic Examination See below:     Comment: Microscopic was indicated and was performed.  Urine Culture     Status: Abnormal   Collection Time: 08/21/23  2:00 PM   Specimen: Urine, Clean Catch   UR  Result Value Ref Range   Urine Culture, Routine Final report (A)    Organism ID, Bacteria Escherichia coli (A)     Comment: Cefazolin  with an MIC <=16 predicts susceptibility to the oral agents cefaclor, cefdinir, cefpodoxime, cefprozil, cefuroxime, cephalexin, and loracarbef when used for therapy of uncomplicated urinary tract infections due to E. coli, Klebsiella pneumoniae, and Proteus mirabilis. Multi-Drug Resistant Organism Greater than 100,000 colony forming units per mL    Antimicrobial Susceptibility Comment     Comment:       ** S = Susceptible; I = Intermediate; R = Resistant **                    P = Positive; N = Negative             MICS are expressed in micrograms per mL    Antibiotic                 RSLT#1    RSLT#2    RSLT#3    RSLT#4 Amoxicillin /Clavulanic Acid    S Ampicillin                     R Cefazolin                      S Cefepime                       S Cefoxitin                      S Cefpodoxime                     S Ceftriaxone                    S Ciprofloxacin                   S Ertapenem                      S Gentamicin                     S Levofloxacin                    I Meropenem                      S Nitrofurantoin                  S Piperacillin/Tazobactam        S Tetracycline                   S Tobramycin                     S Trimethoprim/Sulfa             R   Microscopic Examination     Status: None   Collection Time: 08/21/23  2:00 PM  Result Value Ref Range   WBC, UA None seen 0 - 5 /hpf   RBC, Urine None seen 0 - 2 /hpf   Epithelial Cells (non renal) None seen 0 - 10 /hpf   Casts None seen None seen /lpf   Bacteria, UA None seen None seen/Few  POCT Urinalysis Dipstick ([id number])     Status: Abnormal   Collection Time: 09/10/23 10:34 AM  Result Value Ref Range   Color, UA Yellow    Clarity, UA Cloudy    Glucose, UA Negative Negative   Bilirubin, UA Negative  Ketones, UA Negative    Spec Grav, UA 1.020 1.010 - 1.025   Blood, UA Moderate    pH, UA 6.5 5.0 - 8.0   Protein, UA Positive (A) Negative   Urobilinogen, UA >=8.0 (A) 0.2 or 1.0 E.U./dL   Nitrite, UA Negative    Leukocytes, UA Large (3+) (A) Negative   Appearance Cloudy    Odor Yes   POCT Urinalysis Dipstick ([id number])     Status: Abnormal   Collection Time: 09/20/23 10:21 AM  Result Value Ref Range   Color, UA Dark yellow    Clarity, UA Cloudy    Glucose, UA Negative Negative   Bilirubin, UA Negative    Ketones, UA Negative    Spec Grav, UA 1.025 1.010 - 1.025   Blood, UA Small    pH, UA 5.5 5.0 - 8.0   Protein, UA Negative Negative   Urobilinogen, UA 1.0 0.2 or 1.0 E.U./dL   Nitrite, UA Negative    Leukocytes, UA Trace (A) Negative   Appearance Cloudy    Odor Yes   Urine Culture     Status: None   Collection Time: 09/20/23 12:08 PM   Specimen: Urine   UR  Result Value Ref Range   Urine Culture, Routine Final report    Organism ID, Bacteria Comment     Comment: Mixed urogenital  flora 25,000-50,000 colony forming units per mL   POCT CBG (Fasting - Glucose)     Status: Abnormal   Collection Time: 09/23/23  1:41 PM  Result Value Ref Range   Glucose Fasting, POC 104 (A) 70 - 99 mg/dL  POCT Urinalysis Dipstick ([id number])     Status: None   Collection Time: 09/23/23  2:23 PM  Result Value Ref Range   Color, UA Yellow    Clarity, UA Clear    Glucose, UA Negative Negative   Bilirubin, UA Negative    Ketones, UA Negative    Spec Grav, UA 1.020 1.010 - 1.025   Blood, UA Trace    pH, UA 6.0 5.0 - 8.0   Protein, UA Negative Negative   Urobilinogen, UA 1.0 0.2 or 1.0 E.U./dL   Nitrite, UA Negative    Leukocytes, UA Negative Negative   Appearance Clear    Odor No       Assessment & Plan:  Reduce Trulicity , check labs, GI and urology referrals. Problem List Items Addressed This Visit     T2DM (type 2 diabetes mellitus) (HCC) - Primary   Relevant Medications   Dulaglutide  (TRULICITY ) 3 MG/0.5ML SOAJ   Other Relevant Orders   POCT CBG (Fasting - Glucose) (Completed)   Hemoglobin A1c   Combined hyperlipidemia associated with type 2 diabetes mellitus (HCC)   Relevant Medications   Dulaglutide  (TRULICITY ) 3 MG/0.5ML SOAJ   Other Relevant Orders   Lipid Panel w/o Chol/HDL Ratio   Hypertension associated with diabetes (HCC)   Relevant Medications   Dulaglutide  (TRULICITY ) 3 MG/0.5ML SOAJ   Other Relevant Orders   CMP14+EGFR   Other Visit Diagnoses       Pain of upper abdomen       Relevant Orders   CBC with Diff     Generalized abdominal pain       Relevant Orders   Lipase   Ambulatory referral to Gastroenterology   CT ABDOMEN PELVIS W CONTRAST     Recurrent UTI       Relevant Orders   Ambulatory referral to Urology   POCT Urinalysis Dipstick ([id number]) (Completed)  Return in about 2 weeks (around 10/07/2023).   Total time spent: 30 minutes  Aisha Hove, MD  09/23/2023   This document may have been prepared by [facility] Voice Recognition  software and as such may include unintentional dictation errors.

## 2023-09-24 ENCOUNTER — Other Ambulatory Visit

## 2023-09-24 DIAGNOSIS — E1169 Type 2 diabetes mellitus with other specified complication: Secondary | ICD-10-CM | POA: Diagnosis not present

## 2023-09-24 DIAGNOSIS — E1165 Type 2 diabetes mellitus with hyperglycemia: Secondary | ICD-10-CM | POA: Diagnosis not present

## 2023-09-24 DIAGNOSIS — R1084 Generalized abdominal pain: Secondary | ICD-10-CM | POA: Diagnosis not present

## 2023-09-24 DIAGNOSIS — E1159 Type 2 diabetes mellitus with other circulatory complications: Secondary | ICD-10-CM | POA: Diagnosis not present

## 2023-09-24 DIAGNOSIS — I152 Hypertension secondary to endocrine disorders: Secondary | ICD-10-CM | POA: Diagnosis not present

## 2023-09-24 DIAGNOSIS — E782 Mixed hyperlipidemia: Secondary | ICD-10-CM | POA: Diagnosis not present

## 2023-09-24 DIAGNOSIS — R1010 Upper abdominal pain, unspecified: Secondary | ICD-10-CM | POA: Diagnosis not present

## 2023-09-25 DIAGNOSIS — F331 Major depressive disorder, recurrent, moderate: Secondary | ICD-10-CM | POA: Diagnosis not present

## 2023-09-25 LAB — URINE CULTURE

## 2023-09-26 ENCOUNTER — Encounter: Payer: Self-pay | Admitting: Dermatology

## 2023-09-27 LAB — CBC WITH DIFFERENTIAL/PLATELET
Basophils Absolute: 0 10*3/uL (ref 0.0–0.2)
Basos: 0 %
EOS (ABSOLUTE): 0.1 10*3/uL (ref 0.0–0.4)
Eos: 1 %
Hematocrit: 40.5 % (ref 34.0–46.6)
Hemoglobin: 13.4 g/dL (ref 11.1–15.9)
Immature Grans (Abs): 0 10*3/uL (ref 0.0–0.1)
Immature Granulocytes: 0 %
Lymphocytes Absolute: 1.8 10*3/uL (ref 0.7–3.1)
Lymphs: 32 %
MCH: 30.7 pg (ref 26.6–33.0)
MCHC: 33.1 g/dL (ref 31.5–35.7)
MCV: 93 fL (ref 79–97)
Monocytes Absolute: 0.9 10*3/uL (ref 0.1–0.9)
Monocytes: 15 %
Neutrophils Absolute: 2.8 10*3/uL (ref 1.4–7.0)
Neutrophils: 52 %
Platelets: 128 10*3/uL — ABNORMAL LOW (ref 150–450)
RBC: 4.37 x10E6/uL (ref 3.77–5.28)
RDW: 12.8 % (ref 11.7–15.4)
WBC: 5.6 10*3/uL (ref 3.4–10.8)

## 2023-09-27 LAB — LIPID PANEL W/O CHOL/HDL RATIO
Cholesterol, Total: 157 mg/dL (ref 100–199)
HDL: 72 mg/dL (ref >39)
LDL Chol Calc (NIH): 69 mg/dL (ref 0–99)
Triglycerides: 86 mg/dL (ref 0–149)
VLDL Cholesterol Cal: 16 mg/dL (ref 5–40)

## 2023-09-27 LAB — HEMOGLOBIN A1C
Est. average glucose Bld gHb Est-mCnc: 131 mg/dL
Hgb A1c MFr Bld: 6.2 % — ABNORMAL HIGH (ref 4.8–5.6)

## 2023-09-27 LAB — CMP14+EGFR
ALT: 18 IU/L (ref 0–32)
AST: 21 IU/L (ref 0–40)
Albumin: 4.2 g/dL (ref 3.9–4.9)
Alkaline Phosphatase: 83 IU/L (ref 44–121)
BUN/Creatinine Ratio: 13 (ref 12–28)
BUN: 11 mg/dL (ref 8–27)
Bilirubin Total: 0.5 mg/dL (ref 0.0–1.2)
CO2: 24 mmol/L (ref 20–29)
Calcium: 9.4 mg/dL (ref 8.7–10.3)
Chloride: 102 mmol/L (ref 96–106)
Creatinine, Ser: 0.84 mg/dL (ref 0.57–1.00)
Globulin, Total: 2.4 g/dL (ref 1.5–4.5)
Glucose: 98 mg/dL (ref 70–99)
Potassium: 4.4 mmol/L (ref 3.5–5.2)
Sodium: 140 mmol/L (ref 134–144)
Total Protein: 6.6 g/dL (ref 6.0–8.5)
eGFR: 78 mL/min/{1.73_m2} (ref >59)

## 2023-09-27 LAB — LIPASE: Lipase: 85 U/L — ABNORMAL HIGH (ref 14–72)

## 2023-09-30 DIAGNOSIS — F331 Major depressive disorder, recurrent, moderate: Secondary | ICD-10-CM | POA: Diagnosis not present

## 2023-10-03 ENCOUNTER — Other Ambulatory Visit: Payer: Self-pay

## 2023-10-03 ENCOUNTER — Ambulatory Visit: Payer: Self-pay | Admitting: Internal Medicine

## 2023-10-03 ENCOUNTER — Ambulatory Visit: Payer: 59 | Admitting: Internal Medicine

## 2023-10-03 MED ORDER — TACROLIMUS 0.1 % EX OINT: TOPICAL_OINTMENT | CUTANEOUS | 1 refills | Status: AC

## 2023-10-03 NOTE — Progress Notes (Signed)
 Tacrolimus sent to [facility]./sh

## 2023-10-04 NOTE — Progress Notes (Signed)
 Patient notified

## 2023-10-07 ENCOUNTER — Ambulatory Visit (INDEPENDENT_AMBULATORY_CARE_PROVIDER_SITE_OTHER): Admitting: Internal Medicine

## 2023-10-07 ENCOUNTER — Encounter: Payer: Self-pay | Admitting: Internal Medicine

## 2023-10-07 VITALS — BP 136/68 | HR 103 | Ht 65.0 in | Wt 209.0 lb

## 2023-10-07 DIAGNOSIS — E1169 Type 2 diabetes mellitus with other specified complication: Secondary | ICD-10-CM | POA: Diagnosis not present

## 2023-10-07 DIAGNOSIS — I152 Hypertension secondary to endocrine disorders: Secondary | ICD-10-CM

## 2023-10-07 DIAGNOSIS — E1165 Type 2 diabetes mellitus with hyperglycemia: Secondary | ICD-10-CM | POA: Diagnosis not present

## 2023-10-07 DIAGNOSIS — F411 Generalized anxiety disorder: Secondary | ICD-10-CM | POA: Diagnosis not present

## 2023-10-07 DIAGNOSIS — E782 Mixed hyperlipidemia: Secondary | ICD-10-CM

## 2023-10-07 DIAGNOSIS — E1159 Type 2 diabetes mellitus with other circulatory complications: Secondary | ICD-10-CM | POA: Diagnosis not present

## 2023-10-07 NOTE — Progress Notes (Signed)
 Established Patient Office Visit  Subjective:  Patient ID: Sandra Le, female    DOB: Aug 09, 1960  Age: 63 y.o. MRN: [id number]  Chief Complaint  Patient presents with   Follow-up    3 month follow up    Patient comes in for her follow-up today.  Her abdominal symptoms have resolved since we reduced the dose of Trulicity .  She is no further longer having pain, nausea, or vomiting.  CT scan was not yet scheduled but wants to postpone it since she is already feeling better.  Her lipase was minimally elevated.  Patient advised to let us  know if she starts having symptoms again.  Will repeat lipase at that time and reschedule her CT abdomen. Her urine culture was also unremarkable.  Patient advised to drink plenty of water daily.    No other concerns at this time.   Past Medical History:  Diagnosis Date   Basal cell carcinoma 2024   L shoulder, txted at [facility]   Hyperlipidemia    Hypertension     Past Surgical History:  Procedure Laterality Date   BARIATRIC SURGERY     CESAREAN SECTION      Social History   Socioeconomic History   Marital status: Married    Spouse name: Not on file   Number of children: Not on file   Years of education: Not on file   Highest education level: Not on file  Occupational History   Not on file  Tobacco Use   Smoking status: Former   Smokeless tobacco: Never  Vaping Use   Vaping status: Never Used  Substance and Sexual Activity   Alcohol use: Yes   Drug use: Not on file   Sexual activity: Not on file  Other Topics Concern   Not on file  Social History Narrative   Not on file   Social Drivers of Health   Financial Resource Strain: Not on file  Food Insecurity: Not on file  Transportation Needs: Not on file  Physical Activity: Not on file  Stress: Not on file  Social Connections: Not on file  Intimate Partner Violence: Not on file    Family History  Problem Relation Age of Onset   Breast cancer Neg Hx    Bladder  Cancer Neg Hx    Kidney cancer Neg Hx     No Known Allergies  Outpatient Medications Prior to Visit  Medication Sig   acetaminophen (TYLENOL) 500 MG tablet Take by mouth.   amLODipine  (NORVASC ) 5 MG tablet Take 1 tablet (5 mg total) by mouth daily.   Azelastine HCl 0.15 % SOLN Place into the nose.   b complex vitamins capsule Take 1 capsule by mouth daily.   Cholecalciferol (VITAMIN D3) 50 MCG (2000 UT) capsule Take 2,000 Units by mouth daily.   cloNIDine  (CATAPRES ) 0.3 MG tablet Take 1 tablet (0.3 mg total) by mouth at bedtime.   Continuous Glucose Sensor (DEXCOM G7 SENSOR) MISC Use as directed   Continuous Glucose Sensor (DEXCOM G7 SENSOR) MISC I sensor every 10 days   Dulaglutide  (TRULICITY ) 3 MG/0.5ML SOAJ Inject 3 mg into the skin once a week.   DULoxetine (CYMBALTA) 60 MG capsule    glimepiride  (AMARYL ) 2 MG tablet Take 1 tablet (2 mg total) by mouth daily with breakfast.   hydrOXYzine (ATARAX) 50 MG tablet Take 50 mg by mouth every 8 (eight) hours as needed for anxiety.   ketoconazole  (NIZORAL ) 2 % cream Apply 1 Application topically as directed.  qd to bid to itchy rash lower abdomen, under breast   lisinopril (PRINIVIL,ZESTRIL) 5 MG tablet    Multiple Vitamin (MULTIVITAMIN) capsule Take by mouth.   QUEtiapine (SEROQUEL) 50 MG tablet Take 25 mg by mouth at bedtime.   sertraline (ZOLOFT) 100 MG tablet Take 100 mg by mouth daily.   simvastatin  (ZOCOR ) 20 MG tablet TAKE ONE TABLET BY MOUTH EVERY DAY   tacrolimus  (PROTOPIC ) 0.1 % ointment Apply topically as directed. qd to bid to aa rash lower abdomen and under breast prn flares   Turmeric 400 MG CAPS Take by mouth.   vitamin B-12 (CYANOCOBALAMIN ) 250 MCG tablet Take 250 mcg by mouth in the morning, at noon, and at bedtime.   ziprasidone (GEODON) 20 MG capsule Take 20 mg by mouth daily.   No facility-administered medications prior to visit.    Review of Systems  Constitutional: Negative.  Negative for chills, diaphoresis, fever  and malaise/fatigue.  HENT: Negative.  Negative for sore throat.   Eyes: Negative.   Respiratory: Negative.  Negative for cough and shortness of breath.   Cardiovascular: Negative.  Negative for chest pain, palpitations and leg swelling.  Gastrointestinal: Negative.  Negative for abdominal pain, constipation, diarrhea, heartburn, nausea and vomiting.  Genitourinary: Negative.  Negative for dysuria and flank pain.  Musculoskeletal: Negative.  Negative for joint pain and myalgias.  Skin: Negative.   Neurological: Negative.  Negative for dizziness, tingling and headaches.  Endo/Heme/Allergies: Negative.   Psychiatric/Behavioral: Negative.  Negative for depression and suicidal ideas. The patient is not nervous/anxious.        Objective:   BP 136/68   Pulse (!) 103   Ht 5\' 5"  (1.651 m)   Wt 209 lb (94.8 kg)   SpO2 98%   BMI 34.78 kg/m   Vitals:   10/07/23 1130  BP: 136/68  Pulse: (!) 103  Height: 5\' 5"  (1.651 m)  Weight: 209 lb (94.8 kg)  SpO2: 98%  BMI (Calculated): 34.78    Physical Exam Vitals and nursing note reviewed.  Constitutional:      Appearance: Normal appearance.  HENT:     Head: Normocephalic and atraumatic.     Nose: Nose normal.     Mouth/Throat:     Mouth: Mucous membranes are moist.     Pharynx: Oropharynx is clear.  Eyes:     Conjunctiva/sclera: Conjunctivae normal.     Pupils: Pupils are equal, round, and reactive to light.  Cardiovascular:     Rate and Rhythm: Normal rate and regular rhythm.     Pulses: Normal pulses.     Heart sounds: Normal heart sounds. No murmur heard. Pulmonary:     Effort: Pulmonary effort is normal.     Breath sounds: Normal breath sounds. No wheezing.  Abdominal:     General: Bowel sounds are normal.     Palpations: Abdomen is soft.     Tenderness: There is no abdominal tenderness. There is no right CVA tenderness or left CVA tenderness.  Musculoskeletal:        General: Normal range of motion.     Cervical back:  Normal range of motion.     Right lower leg: No edema.     Left lower leg: No edema.  Skin:    General: Skin is warm and dry.  Neurological:     General: No focal deficit present.     Mental Status: She is alert and oriented to person, place, and time.  Psychiatric:  Mood and Affect: Mood normal.        Behavior: Behavior normal.      No results found for any visits on 10/07/23.  Recent Results (from the past 2160 hours)  POC Influenza A&B (Binax test)     Status: None   Collection Time: 07/19/23  3:39 PM  Result Value Ref Range   Influenza A, POC Negative Negative   Influenza B, POC Negative Negative  POCT Urinalysis Dipstick (04540)     Status: Abnormal   Collection Time: 08/21/23 11:16 AM  Result Value Ref Range   Color, UA YELLOW    Clarity, UA CLEAR    Glucose, UA Negative Negative   Bilirubin, UA 1+    Ketones, UA NEGATIVE    Spec Grav, UA 1.020 1.010 - 1.025   Blood, UA 3+    pH, UA 6.0 5.0 - 8.0   Protein, UA Positive (A) Negative   Urobilinogen, UA 1.0 0.2 or 1.0 E.U./dL   Nitrite, UA NEGATIVE    Leukocytes, UA Large (3+) (A) Negative   Appearance CLOUDY    Odor YES   Urinalysis, Routine w reflex microscopic     Status: Abnormal   Collection Time: 08/21/23  2:00 PM  Result Value Ref Range   Specific Gravity, UA 1.018 1.005 - 1.030   pH, UA 6.5 5.0 - 7.5   Color, UA Yellow Yellow   Appearance Ur Cloudy (A) Clear   Leukocytes,UA 3+ (A) Negative   Protein,UA 3+ (A) Negative/Trace   Glucose, UA Negative Negative   Ketones, UA Negative Negative   RBC, UA 2+ (A) Negative   Bilirubin, UA CANCELED     Comment: Test not performed. Unable to perform test due to current unavailability of reagents.  Result canceled by the ancillary.    Urobilinogen, Ur 1.0 0.2 - 1.0 mg/dL   Nitrite, UA Positive (A) Negative   Microscopic Examination See below:     Comment: Microscopic was indicated and was performed.  Urine Culture     Status: Abnormal   Collection  Time: 08/21/23  2:00 PM   Specimen: Urine, Clean Catch   UR  Result Value Ref Range   Urine Culture, Routine Final report (A)    Organism ID, Bacteria Escherichia coli (A)     Comment: Cefazolin with an MIC <=16 predicts susceptibility to the oral agents cefaclor, cefdinir, cefpodoxime, cefprozil, cefuroxime, cephalexin, and loracarbef when used for therapy of uncomplicated urinary tract infections due to E. coli, Klebsiella pneumoniae, and Proteus mirabilis. Multi-Drug Resistant Organism Greater than 100,000 colony forming units per mL    Antimicrobial Susceptibility Comment     Comment:       ** S = Susceptible; I = Intermediate; R = Resistant **                    P = Positive; N = Negative             MICS are expressed in micrograms per mL    Antibiotic                 RSLT#1    RSLT#2    RSLT#3    RSLT#4 Amoxicillin /Clavulanic Acid    S Ampicillin                     R Cefazolin                      S Cefepime  S Cefoxitin                      S Cefpodoxime                    S Ceftriaxone                    S Ciprofloxacin                   S Ertapenem                      S Gentamicin                     S Levofloxacin                    I Meropenem                      S Nitrofurantoin                  S Piperacillin/Tazobactam        S Tetracycline                   S Tobramycin                     S Trimethoprim/Sulfa             R   Microscopic Examination     Status: None   Collection Time: 08/21/23  2:00 PM  Result Value Ref Range   WBC, UA None seen 0 - 5 /hpf   RBC, Urine None seen 0 - 2 /hpf   Epithelial Cells (non renal) None seen 0 - 10 /hpf   Casts None seen None seen /lpf   Bacteria, UA None seen None seen/Few  POCT Urinalysis Dipstick ([id number])     Status: Abnormal   Collection Time: 09/10/23 10:34 AM  Result Value Ref Range   Color, UA Yellow    Clarity, UA Cloudy    Glucose, UA Negative Negative   Bilirubin, UA Negative     Ketones, UA Negative    Spec Grav, UA 1.020 1.010 - 1.025   Blood, UA Moderate    pH, UA 6.5 5.0 - 8.0   Protein, UA Positive (A) Negative   Urobilinogen, UA >=8.0 (A) 0.2 or 1.0 E.U./dL   Nitrite, UA Negative    Leukocytes, UA Large (3+) (A) Negative   Appearance Cloudy    Odor Yes   POCT Urinalysis Dipstick ([id number])     Status: Abnormal   Collection Time: 09/20/23 10:21 AM  Result Value Ref Range   Color, UA Dark yellow    Clarity, UA Cloudy    Glucose, UA Negative Negative   Bilirubin, UA Negative    Ketones, UA Negative    Spec Grav, UA 1.025 1.010 - 1.025   Blood, UA Small    pH, UA 5.5 5.0 - 8.0   Protein, UA Negative Negative   Urobilinogen, UA 1.0 0.2 or 1.0 E.U./dL   Nitrite, UA Negative    Leukocytes, UA Trace (A) Negative   Appearance Cloudy    Odor Yes   Urine Culture     Status: None   Collection Time: 09/20/23 12:08 PM   Specimen: Urine   UR  Result Value Ref Range   Urine Culture, Routine Final report    Organism ID,  Bacteria Comment     Comment: Mixed urogenital flora 25,000-50,000 colony forming units per mL   POCT CBG (Fasting - Glucose)     Status: Abnormal   Collection Time: 09/23/23  1:41 PM  Result Value Ref Range   Glucose Fasting, POC 104 (A) 70 - 99 mg/dL  POCT Urinalysis Dipstick ([id number])     Status: None   Collection Time: 09/23/23  2:23 PM  Result Value Ref Range   Color, UA Yellow    Clarity, UA Clear    Glucose, UA Negative Negative   Bilirubin, UA Negative    Ketones, UA Negative    Spec Grav, UA 1.020 1.010 - 1.025   Blood, UA Trace    pH, UA 6.0 5.0 - 8.0   Protein, UA Negative Negative   Urobilinogen, UA 1.0 0.2 or 1.0 E.U./dL   Nitrite, UA Negative    Leukocytes, UA Negative Negative   Appearance Clear    Odor No   Urine Culture     Status: None   Collection Time: 09/23/23  3:49 PM   Specimen: Urine   UR  Result Value Ref Range   Urine Culture, Routine Final report    Organism ID, Bacteria Comment     Comment: Mixed  urogenital flora 10,000-25,000 colony forming units per mL   Lipase     Status: Abnormal   Collection Time: 09/24/23  8:31 AM  Result Value Ref Range   Lipase 85 (H) 14 - 72 U/L  CBC with Diff     Status: Abnormal   Collection Time: 09/24/23  8:31 AM  Result Value Ref Range   WBC 5.6 3.4 - 10.8 x10E3/uL   RBC 4.37 3.77 - 5.28 x10E6/uL   Hemoglobin 13.4 11.1 - 15.9 g/dL   Hematocrit 60.4 54.0 - 46.6 %   MCV 93 79 - 97 fL   MCH 30.7 26.6 - 33.0 pg   MCHC 33.1 31.5 - 35.7 g/dL   RDW 98.1 19.1 - 47.8 %   Platelets 128 (L) 150 - 450 x10E3/uL   Neutrophils 52 Not Estab. %   Lymphs 32 Not Estab. %   Monocytes 15 Not Estab. %   Eos 1 Not Estab. %   Basos 0 Not Estab. %   Neutrophils Absolute 2.8 1.4 - 7.0 x10E3/uL   Lymphocytes Absolute 1.8 0.7 - 3.1 x10E3/uL   Monocytes Absolute 0.9 0.1 - 0.9 x10E3/uL   EOS (ABSOLUTE) 0.1 0.0 - 0.4 x10E3/uL   Basophils Absolute 0.0 0.0 - 0.2 x10E3/uL   Immature Granulocytes 0 Not Estab. %   Immature Grans (Abs) 0.0 0.0 - 0.1 x10E3/uL  Lipid Panel w/o Chol/HDL Ratio     Status: None   Collection Time: 09/24/23  8:31 AM  Result Value Ref Range   Cholesterol, Total 157 100 - 199 mg/dL   Triglycerides 86 0 - 149 mg/dL   HDL 72 >29 mg/dL   VLDL Cholesterol Cal 16 5 - 40 mg/dL   LDL Chol Calc (NIH) 69 0 - 99 mg/dL  [id number]     Status: None   Collection Time: 09/24/23  8:31 AM  Result Value Ref Range   Glucose 98 70 - 99 mg/dL   BUN 11 8 - 27 mg/dL   Creatinine, Ser 7.84 0.57 - 1.00 mg/dL   eGFR 78 >69 GE/XBM/8.41   BUN/Creatinine Ratio 13 12 - 28   Sodium 140 134 - 144 mmol/L   Potassium 4.4 3.5 - 5.2 mmol/L   Chloride 102 96 -  106 mmol/L   CO2 24 20 - 29 mmol/L   Calcium 9.4 8.7 - 10.3 mg/dL   Total Protein 6.6 6.0 - 8.5 g/dL   Albumin 4.2 3.9 - 4.9 g/dL   Globulin, Total 2.4 1.5 - 4.5 g/dL   Bilirubin Total 0.5 0.0 - 1.2 mg/dL   Alkaline Phosphatase 83 44 - 121 IU/L   AST 21 0 - 40 IU/L   ALT 18 0 - 32 IU/L  Hemoglobin A1c      Status: Abnormal   Collection Time: 09/24/23  8:31 AM  Result Value Ref Range   Hgb A1c MFr Bld 6.2 (H) 4.8 - 5.6 %    Comment:          Prediabetes: 5.7 - 6.4          Diabetes: >6.4          Glycemic control for adults with diabetes: <7.0    Est. average glucose Bld gHb Est-mCnc 131 mg/dL      Assessment & Plan:  Patient will continue Trulicity  3 mg/week.  Monitor for return of abdominal pain and discomfort. Problem List Items Addressed This Visit     Morbid obesity (HCC)   T2DM (type 2 diabetes mellitus) (HCC)   Combined hyperlipidemia associated with type 2 diabetes mellitus (HCC)   Hypertension associated with diabetes (HCC) - Primary   GAD (generalized anxiety disorder)    Return in about 3 months (around 01/07/2024).   Total time spent: 30 minutes  Aisha Hove, MD  10/07/2023   This document may have been prepared by [facility] Voice Recognition software and as such may include unintentional dictation errors.

## 2023-10-08 ENCOUNTER — Ambulatory Visit: Payer: Self-pay | Admitting: Urology

## 2023-10-08 ENCOUNTER — Encounter: Payer: Self-pay | Admitting: Urology

## 2023-10-08 ENCOUNTER — Other Ambulatory Visit: Payer: Self-pay

## 2023-10-08 ENCOUNTER — Other Ambulatory Visit
Admission: RE | Admit: 2023-10-08 | Discharge: 2023-10-08 | Disposition: A | Discharge status: Home / Self Care | Attending: Urology | Admitting: Urology

## 2023-10-08 ENCOUNTER — Ambulatory Visit: Admitting: Urology

## 2023-10-08 VITALS — BP 136/81 | HR 69 | Ht 65.0 in | Wt 209.0 lb

## 2023-10-08 DIAGNOSIS — N390 Urinary tract infection, site not specified: Secondary | ICD-10-CM | POA: Diagnosis not present

## 2023-10-08 DIAGNOSIS — F39 Unspecified mood [affective] disorder: Secondary | ICD-10-CM | POA: Diagnosis not present

## 2023-10-08 DIAGNOSIS — F5105 Insomnia due to other mental disorder: Secondary | ICD-10-CM | POA: Diagnosis not present

## 2023-10-08 DIAGNOSIS — F4312 Post-traumatic stress disorder, chronic: Secondary | ICD-10-CM | POA: Diagnosis not present

## 2023-10-08 DIAGNOSIS — F411 Generalized anxiety disorder: Secondary | ICD-10-CM | POA: Diagnosis not present

## 2023-10-08 LAB — URINALYSIS, COMPLETE (UACMP) WITH MICROSCOPIC
Bilirubin Urine: NEGATIVE
Glucose, UA: NEGATIVE mg/dL
Ketones, ur: NEGATIVE mg/dL
Leukocytes,Ua: NEGATIVE
Nitrite: NEGATIVE
Protein, ur: NEGATIVE mg/dL
Specific Gravity, Urine: 1.02 (ref 1.005–1.030)
pH: 5.5 (ref 5.0–8.0)

## 2023-10-08 NOTE — Patient Instructions (Signed)

## 2023-10-08 NOTE — Progress Notes (Signed)
   10/08/23 2:42 PM   JANAE BONSER 09-22-1960 [id number]  CC: Recurrent UTI  HPI: 63 year old female previously seen by Dr. Jennine Mohair in 2019 for microscopic hematuria workup which was negative.  She was referred for 2 recent UTIs over 3-week period.  It sounds like her initial UTI was just incompletely treated and required a longer course of antibiotics.  She denies any symptoms today.  Prior to that she had not had any problems with recurrent infections.  Urinalysis today is benign.  She denies any gross hematuria or flank pain.   PMH: Past Medical History:  Diagnosis Date   Basal cell carcinoma 2024   L shoulder, txted at [facility]   Hyperlipidemia    Hypertension     Surgical History: Past Surgical History:  Procedure Laterality Date   BARIATRIC SURGERY     CESAREAN SECTION         Family History: Family History  Problem Relation Age of Onset   Breast cancer Neg Hx    Bladder Cancer Neg Hx    Kidney cancer Neg Hx     Social History:  reports that she has quit smoking. She has never used smokeless tobacco. She reports current alcohol use. No history on file for drug use.  Physical Exam: BP 136/81   Pulse 69   Ht 5\' 5"  (1.651 m)   Wt 209 lb (94.8 kg)   BMI 34.78 kg/m    Constitutional:  Alert and oriented, No acute distress. Cardiovascular: No clubbing, cyanosis, or edema. Respiratory: Normal respiratory effort, no increased work of breathing. GI: Abdomen is soft, nontender, nondistended, no abdominal masses   Laboratory Data: Urinalysis today benign   Assessment & Plan:   63 year old female with 2 UTIs over 3-week period, may have just been single UTI that was incompletely treated.  No symptoms today, urinalysis benign.  We discussed the evaluation and treatment of patients with recurrent UTIs at length.  We specifically discussed the differences between asymptomatic bacteriuria and true urinary tract infection.  We discussed the AUA definition  of recurrent UTI of at least 2 culture proven symptomatic acute cystitis episodes in a 16-month period, or 3 within a 1 year period.  We discussed the importance of culture directed antibiotic treatment, and antibiotic stewardship.  First-line therapy includes nitrofurantoin (5 days), Bactrim(3 days), or fosfomycin(3 g single dose).  Possible etiologies of recurrent infection include periurethral tissue atrophy in postmenopausal woman, constipation, sexual activity, incomplete emptying, anatomic abnormalities, and even genetic predisposition.  Finally, we discussed the role of perineal hygiene, timed voiding, adequate hydration, topical vaginal estrogen, cranberry prophylaxis, and low-dose antibiotic prophylaxis.  Recommend cranberry tablets for UTI prevention, consider topical estrogen cream or 90-day prophylaxis if recurrent infections   Jay Meth, MD 10/08/2023  [facility] [address], [address] [place], [place] [address] [phone]

## 2023-10-16 DIAGNOSIS — F331 Major depressive disorder, recurrent, moderate: Secondary | ICD-10-CM | POA: Diagnosis not present

## 2023-10-28 ENCOUNTER — Encounter: Payer: Self-pay | Admitting: Internal Medicine

## 2023-11-04 DIAGNOSIS — F331 Major depressive disorder, recurrent, moderate: Secondary | ICD-10-CM | POA: Diagnosis not present

## 2023-11-18 DIAGNOSIS — F331 Major depressive disorder, recurrent, moderate: Secondary | ICD-10-CM | POA: Diagnosis not present

## 2023-12-02 DIAGNOSIS — F331 Major depressive disorder, recurrent, moderate: Secondary | ICD-10-CM | POA: Diagnosis not present

## 2023-12-04 DIAGNOSIS — F411 Generalized anxiety disorder: Secondary | ICD-10-CM | POA: Diagnosis not present

## 2023-12-04 DIAGNOSIS — F5105 Insomnia due to other mental disorder: Secondary | ICD-10-CM | POA: Diagnosis not present

## 2023-12-04 DIAGNOSIS — F39 Unspecified mood [affective] disorder: Secondary | ICD-10-CM | POA: Diagnosis not present

## 2023-12-04 DIAGNOSIS — F4312 Post-traumatic stress disorder, chronic: Secondary | ICD-10-CM | POA: Diagnosis not present

## 2023-12-07 ENCOUNTER — Other Ambulatory Visit: Payer: Self-pay | Admitting: Internal Medicine

## 2023-12-07 DIAGNOSIS — E1165 Type 2 diabetes mellitus with hyperglycemia: Secondary | ICD-10-CM

## 2023-12-17 DIAGNOSIS — F331 Major depressive disorder, recurrent, moderate: Secondary | ICD-10-CM | POA: Diagnosis not present

## 2023-12-25 ENCOUNTER — Ambulatory Visit (INDEPENDENT_AMBULATORY_CARE_PROVIDER_SITE_OTHER): Admitting: Family

## 2023-12-25 DIAGNOSIS — N390 Urinary tract infection, site not specified: Secondary | ICD-10-CM | POA: Diagnosis not present

## 2023-12-25 LAB — POCT URINALYSIS DIPSTICK
Bilirubin, UA: POSITIVE
Blood, UA: POSITIVE
Glucose, UA: NEGATIVE
Ketones, UA: POSITIVE
Nitrite, UA: POSITIVE
Protein, UA: NEGATIVE
Spec Grav, UA: 1.025 (ref 1.010–1.025)
Urobilinogen, UA: 0.2 U/dL
pH, UA: 6 (ref 5.0–8.0)

## 2023-12-26 ENCOUNTER — Other Ambulatory Visit: Payer: Self-pay | Admitting: Cardiology

## 2023-12-26 ENCOUNTER — Encounter: Payer: Self-pay | Admitting: Family

## 2023-12-26 MED ORDER — NITROFURANTOIN MONOHYD MACRO 100 MG PO CAPS: 100.0000 mg | ORAL_CAPSULE | Freq: Two times a day (BID) | ORAL | 0 refills | Status: DC

## 2023-12-26 MED ORDER — CIPROFLOXACIN HCL 500 MG PO TABS: 500.0000 mg | ORAL_TABLET | Freq: Two times a day (BID) | ORAL | 0 refills | Status: AC

## 2023-12-26 NOTE — Progress Notes (Signed)
   CHIEF COMPLAINT  UA/ only visit fot UTI     REASON FOR VISIT  Possible UTI, UA Visit Only      ASSESSMENT & PLAN Diagnoses and all orders for this visit:  Recurrent UTI -     POCT Urinalysis Dipstick ([id number]) -     Culture, Urine     Patient notified.  Total time spent: 5 minutes  ALAN CHRISTELLA ARRANT, FNP 12/25/2023

## 2023-12-27 ENCOUNTER — Telehealth: Payer: Self-pay

## 2023-12-27 NOTE — Telephone Encounter (Signed)
 Pt reached out to us  because she was seen by her primary care office this week for UTI symptoms and had not heard anything from them yet. I looked up patients labs and see that her primary may have called in antibiotics yesterday and that she should call that office to F/U on that and see about getting those meds. I suggested for her to make a F/U with us  for her recurrent UTIs as she has been seen recently by Dr. Francisca. Patient understood and will call back soon to schedule a follow up when she is back in town.

## 2023-12-28 LAB — URINE CULTURE

## 2023-12-31 DIAGNOSIS — F331 Major depressive disorder, recurrent, moderate: Secondary | ICD-10-CM | POA: Diagnosis not present

## 2024-01-01 ENCOUNTER — Encounter: Payer: Self-pay | Admitting: Family

## 2024-01-03 ENCOUNTER — Other Ambulatory Visit

## 2024-01-03 ENCOUNTER — Other Ambulatory Visit: Payer: Self-pay | Admitting: Internal Medicine

## 2024-01-03 ENCOUNTER — Encounter: Payer: Self-pay | Admitting: Dermatology

## 2024-01-03 DIAGNOSIS — E1165 Type 2 diabetes mellitus with hyperglycemia: Secondary | ICD-10-CM | POA: Diagnosis not present

## 2024-01-03 DIAGNOSIS — E1159 Type 2 diabetes mellitus with other circulatory complications: Secondary | ICD-10-CM | POA: Diagnosis not present

## 2024-01-03 DIAGNOSIS — R1010 Upper abdominal pain, unspecified: Secondary | ICD-10-CM | POA: Diagnosis not present

## 2024-01-03 DIAGNOSIS — E782 Mixed hyperlipidemia: Secondary | ICD-10-CM | POA: Diagnosis not present

## 2024-01-03 DIAGNOSIS — I152 Hypertension secondary to endocrine disorders: Secondary | ICD-10-CM | POA: Diagnosis not present

## 2024-01-03 DIAGNOSIS — E1169 Type 2 diabetes mellitus with other specified complication: Secondary | ICD-10-CM | POA: Diagnosis not present

## 2024-01-04 LAB — CBC WITH DIFFERENTIAL/PLATELET
Basophils Absolute: 0 x10E3/uL (ref 0.0–0.2)
Basos: 1 %
EOS (ABSOLUTE): 0.1 x10E3/uL (ref 0.0–0.4)
Eos: 1 %
Hematocrit: 41.7 % (ref 34.0–46.6)
Hemoglobin: 13.5 g/dL (ref 11.1–15.9)
Immature Grans (Abs): 0 x10E3/uL (ref 0.0–0.1)
Immature Granulocytes: 0 %
Lymphocytes Absolute: 1.3 x10E3/uL (ref 0.7–3.1)
Lymphs: 32 %
MCH: 30.6 pg (ref 26.6–33.0)
MCHC: 32.4 g/dL (ref 31.5–35.7)
MCV: 95 fL (ref 79–97)
Monocytes Absolute: 0.7 x10E3/uL (ref 0.1–0.9)
Monocytes: 18 %
Neutrophils Absolute: 2 x10E3/uL (ref 1.4–7.0)
Neutrophils: 48 %
Platelets: 159 x10E3/uL (ref 150–450)
RBC: 4.41 x10E6/uL (ref 3.77–5.28)
RDW: 12.8 % (ref 11.7–15.4)
WBC: 4 x10E3/uL (ref 3.4–10.8)

## 2024-01-04 LAB — CMP14+EGFR
ALT: 104 IU/L — ABNORMAL HIGH (ref 0–32)
AST: 119 IU/L — ABNORMAL HIGH (ref 0–40)
Albumin: 4.2 g/dL (ref 3.9–4.9)
Alkaline Phosphatase: 108 IU/L (ref 44–121)
BUN/Creatinine Ratio: 8 — ABNORMAL LOW (ref 12–28)
BUN: 7 mg/dL — ABNORMAL LOW (ref 8–27)
Bilirubin Total: 0.4 mg/dL (ref 0.0–1.2)
CO2: 21 mmol/L (ref 20–29)
Calcium: 9.4 mg/dL (ref 8.7–10.3)
Chloride: 103 mmol/L (ref 96–106)
Creatinine, Ser: 0.83 mg/dL (ref 0.57–1.00)
Globulin, Total: 2.6 g/dL (ref 1.5–4.5)
Glucose: 81 mg/dL (ref 70–99)
Potassium: 4.6 mmol/L (ref 3.5–5.2)
Sodium: 144 mmol/L (ref 134–144)
Total Protein: 6.8 g/dL (ref 6.0–8.5)
eGFR: 79 mL/min/1.73 (ref >59)

## 2024-01-04 LAB — LIPID PANEL W/O CHOL/HDL RATIO
Cholesterol, Total: 154 mg/dL (ref 100–199)
HDL: 60 mg/dL (ref >39)
LDL Chol Calc (NIH): 75 mg/dL (ref 0–99)
Triglycerides: 105 mg/dL (ref 0–149)
VLDL Cholesterol Cal: 19 mg/dL (ref 5–40)

## 2024-01-04 LAB — HEMOGLOBIN A1C
Est. average glucose Bld gHb Est-mCnc: 128 mg/dL
Hgb A1c MFr Bld: 6.1 % — ABNORMAL HIGH (ref 4.8–5.6)

## 2024-01-06 ENCOUNTER — Ambulatory Visit: Payer: Self-pay | Admitting: Internal Medicine

## 2024-01-06 MED ORDER — KETOCONAZOLE 2 % EX CREA: 1.0000 | TOPICAL_CREAM | CUTANEOUS | 5 refills | Status: AC

## 2024-01-07 ENCOUNTER — Ambulatory Visit (INDEPENDENT_AMBULATORY_CARE_PROVIDER_SITE_OTHER): Admitting: Internal Medicine

## 2024-01-07 ENCOUNTER — Encounter: Payer: Self-pay | Admitting: Internal Medicine

## 2024-01-07 VITALS — BP 110/74 | HR 82 | Ht 65.0 in | Wt 211.8 lb

## 2024-01-07 DIAGNOSIS — I152 Hypertension secondary to endocrine disorders: Secondary | ICD-10-CM

## 2024-01-07 DIAGNOSIS — E1169 Type 2 diabetes mellitus with other specified complication: Secondary | ICD-10-CM

## 2024-01-07 DIAGNOSIS — R1010 Upper abdominal pain, unspecified: Secondary | ICD-10-CM | POA: Diagnosis not present

## 2024-01-07 DIAGNOSIS — K219 Gastro-esophageal reflux disease without esophagitis: Secondary | ICD-10-CM | POA: Diagnosis not present

## 2024-01-07 DIAGNOSIS — E1159 Type 2 diabetes mellitus with other circulatory complications: Secondary | ICD-10-CM | POA: Diagnosis not present

## 2024-01-07 DIAGNOSIS — F3289 Other specified depressive episodes: Secondary | ICD-10-CM | POA: Diagnosis not present

## 2024-01-07 DIAGNOSIS — E782 Mixed hyperlipidemia: Secondary | ICD-10-CM | POA: Diagnosis not present

## 2024-01-07 DIAGNOSIS — E1165 Type 2 diabetes mellitus with hyperglycemia: Secondary | ICD-10-CM | POA: Diagnosis not present

## 2024-01-07 MED ORDER — PANTOPRAZOLE SODIUM 40 MG PO TBEC: 40.0000 mg | DELAYED_RELEASE_TABLET | Freq: Every day | ORAL | 1 refills | Status: AC

## 2024-01-07 MED ORDER — TRULICITY 3 MG/0.5ML ~~LOC~~ SOAJ: 3.0000 mg | SUBCUTANEOUS | 1 refills | Status: DC

## 2024-01-07 NOTE — Progress Notes (Signed)
 Established Patient Office Visit  Subjective:  Patient ID: Sandra Le, female    DOB: [birth year]  Age: 63 y.o. MRN: [id number]  Chief Complaint  Patient presents with   Follow-up    3 month follow up    Patient comes in for follow up. She complains of indigestion and epigastric fullness for last few weeks, getting slowly worse. Patient was recently treated for UTI. Occasionally gets loose BM. No nausea or vomiting. Tolerating Trulicity . Has h/x of Gallstones, recent labs show elevated Liver enzymes. Admits to increase stress and increased Alcohol intake. Pt advised to cut back on Alcohol, tylenol and fried fatty food- Will schedule abdominal ultrasound.    No other concerns at this time.   Past Medical History:  Diagnosis Date   Basal cell carcinoma 2024   L shoulder, txted at [facility]   Hyperlipidemia    Hypertension     Past Surgical History:  Procedure Laterality Date   BARIATRIC SURGERY     CESAREAN SECTION      Social History   Socioeconomic History   Marital status: Married    Spouse name: Not on file   Number of children: Not on file   Years of education: Not on file   Highest education level: Not on file  Occupational History   Not on file  Tobacco Use   Smoking status: Former   Smokeless tobacco: Never  Vaping Use   Vaping status: Never Used  Substance and Sexual Activity   Alcohol use: Yes   Drug use: Not on file   Sexual activity: Not on file  Other Topics Concern   Not on file  Social History Narrative   Not on file   Social Drivers of Health   Financial Resource Strain: Not on file  Food Insecurity: Not on file  Transportation Needs: Not on file  Physical Activity: Not on file  Stress: Not on file  Social Connections: Not on file  Intimate Partner Violence: Not on file    Family History  Problem Relation Age of Onset   Breast cancer Neg Hx    Bladder Cancer Neg Hx    Kidney cancer Neg Hx     Allergies  Allergen  Reactions   Macrobid  [Nitrofurantoin ] Nausea Only    Outpatient Medications Prior to Visit  Medication Sig   acetaminophen (TYLENOL) 500 MG tablet Take by mouth.   amLODipine  (NORVASC ) 5 MG tablet Take 1 tablet (5 mg total) by mouth daily.   b complex vitamins capsule Take 1 capsule by mouth daily.   Cholecalciferol (VITAMIN D3) 50 MCG (2000 UT) capsule Take 2,000 Units by mouth daily.   cloNIDine  (CATAPRES ) 0.3 MG tablet Take 1 tablet (0.3 mg total) by mouth at bedtime. (Patient taking differently: Take 0.15 mg by mouth at bedtime.)   DULoxetine (CYMBALTA) 60 MG capsule    glimepiride  (AMARYL ) 2 MG tablet Take 1 tablet (2 mg total) by mouth daily with breakfast.   hydrOXYzine (ATARAX) 50 MG tablet Take 50 mg by mouth every 8 (eight) hours as needed for anxiety.   ketoconazole  (NIZORAL ) 2 % cream Apply 1 Application topically as directed. every day/bid to body folds for itchy rash   Multiple Vitamin (MULTIVITAMIN) capsule Take by mouth.   QUEtiapine (SEROQUEL) 25 MG tablet Take 25 mg by mouth at bedtime.   sertraline (ZOLOFT) 100 MG tablet Take 100 mg by mouth daily.   simvastatin  (ZOCOR ) 20 MG tablet TAKE ONE TABLET BY MOUTH EVERY DAY (Patient  taking differently: Take 10 mg by mouth daily.)   tacrolimus  (PROTOPIC ) 0.1 % ointment Apply topically as directed. qd to bid to aa rash lower abdomen and under breast prn flares   Turmeric 400 MG CAPS Take by mouth.   vitamin B-12 (CYANOCOBALAMIN ) 250 MCG tablet Take 250 mcg by mouth in the morning, at noon, and at bedtime.   [DISCONTINUED] TRULICITY  3 MG/0.5ML SOAJ INJECT ONE SYRINGEFUL INTO THE SKIN ONCE WEEKLY   Azelastine HCl 0.15 % SOLN Place into the nose. (Patient not taking: Reported on 01/07/2024)   Continuous Glucose Sensor (DEXCOM G7 SENSOR) MISC Use as directed (Patient not taking: Reported on 01/07/2024)   Continuous Glucose Sensor (DEXCOM G7 SENSOR) MISC I sensor every 10 days (Patient not taking: Reported on 01/07/2024)   lisinopril  (PRINIVIL,ZESTRIL) 5 MG tablet  (Patient not taking: Reported on 01/07/2024)   QUEtiapine (SEROQUEL) 50 MG tablet Take 25 mg by mouth at bedtime. (Patient not taking: Reported on 01/07/2024)   ziprasidone (GEODON) 20 MG capsule Take 20 mg by mouth daily. (Patient not taking: Reported on 01/07/2024)   No facility-administered medications prior to visit.    Review of Systems  Constitutional:  Positive for malaise/fatigue. Negative for chills, fever and weight loss.  HENT: Negative.  Negative for sinus pain and sore throat.   Eyes: Negative.   Respiratory: Negative.  Negative for cough and shortness of breath.   Cardiovascular: Negative.  Negative for chest pain, palpitations and leg swelling.  Gastrointestinal:  Positive for abdominal pain and heartburn. Negative for blood in stool, constipation, diarrhea, melena, nausea and vomiting.  Genitourinary: Negative.  Negative for dysuria and flank pain.  Musculoskeletal: Negative.  Negative for joint pain and myalgias.  Skin: Negative.   Neurological: Negative.  Negative for dizziness, tingling, tremors and headaches.  Endo/Heme/Allergies: Negative.   Psychiatric/Behavioral: Negative.  Negative for depression and suicidal ideas. The patient is not nervous/anxious.        Objective:   BP 110/74   Pulse 82   Ht 5' 5 (1.651 m)   Wt 211 lb 12.8 oz (96.1 kg)   SpO2 98%   BMI 35.25 kg/m   Vitals:   01/07/24 1101  BP: 110/74  Pulse: 82  Height: 5' 5 (1.651 m)  Weight: 211 lb 12.8 oz (96.1 kg)  SpO2: 98%  BMI (Calculated): 35.25    Physical Exam Vitals and nursing note reviewed.  Constitutional:      Appearance: Normal appearance.  HENT:     Head: Normocephalic and atraumatic.     Nose: Nose normal.     Mouth/Throat:     Mouth: Mucous membranes are moist.     Pharynx: Oropharynx is clear.  Eyes:     Conjunctiva/sclera: Conjunctivae normal.     Pupils: Pupils are equal, round, and reactive to light.  Cardiovascular:     Rate  and Rhythm: Normal rate and regular rhythm.     Pulses: Normal pulses.     Heart sounds: Normal heart sounds. No murmur heard. Pulmonary:     Effort: Pulmonary effort is normal.     Breath sounds: Normal breath sounds. No wheezing.  Abdominal:     General: Bowel sounds are normal.     Palpations: Abdomen is soft.     Tenderness: There is no abdominal tenderness. There is no right CVA tenderness or left CVA tenderness.  Musculoskeletal:        General: Normal range of motion.     Cervical back: Normal range of  motion.     Right lower leg: No edema.     Left lower leg: No edema.  Skin:    General: Skin is warm and dry.  Neurological:     General: No focal deficit present.     Mental Status: She is alert and oriented to person, place, and time.  Psychiatric:        Mood and Affect: Mood normal.        Behavior: Behavior normal.      No results found for any visits on 01/07/24.  Recent Results (from the past 2160 hours)  POCT Urinalysis Dipstick (18997)     Status: Abnormal   Collection Time: 12/25/23  3:00 PM  Result Value Ref Range   Color, UA dark yellow    Clarity, UA turbid    Glucose, UA Negative Negative   Bilirubin, UA positive    Ketones, UA positive    Spec Grav, UA 1.025 1.010 - 1.025   Blood, UA positive    pH, UA 6.0 5.0 - 8.0   Protein, UA Negative Negative   Urobilinogen, UA 0.2 0.2 or 1.0 E.U./dL   Nitrite, UA positive    Leukocytes, UA Small (1+) (A) Negative   Appearance     Odor    Culture, Urine     Status: Abnormal   Collection Time: 12/25/23  3:47 PM   Specimen: Urine   UR  Result Value Ref Range   Urine Culture, Routine Final report (A)    Organism ID, Bacteria Klebsiella pneumoniae (A)     Comment: Cefazolin with an MIC <=16 predicts susceptibility to the oral agents cefaclor, cefdinir, cefpodoxime, cefprozil, cefuroxime, cephalexin, and loracarbef when used for therapy of uncomplicated urinary tract infections due to E. coli, Klebsiella  pneumoniae, and Proteus mirabilis. Greater than 100,000 colony forming units per mL    Antimicrobial Susceptibility Comment     Comment:       ** S = Susceptible; I = Intermediate; R = Resistant **                    P = Positive; N = Negative             MICS are expressed in micrograms per mL    Antibiotic                 RSLT#1    RSLT#2    RSLT#3    RSLT#4 Amoxicillin /Clavulanic Acid    S Ampicillin                     R Cefazolin                      S Cefepime                       S Cefoxitin                      S Cefpodoxime                    S Ceftriaxone                    S Ciprofloxacin                   S Ertapenem  S Gentamicin                     S Levofloxacin                    S Meropenem                      S Nitrofurantoin                  I Piperacillin/Tazobactam        S Tetracycline                   S Tobramycin                     S Trimethoprim/Sulfa             S   CMP14+EGFR     Status: Abnormal   Collection Time: 01/03/24 10:25 AM  Result Value Ref Range   Glucose 81 70 - 99 mg/dL   BUN 7 (L) 8 - 27 mg/dL   Creatinine, Ser 9.16 0.57 - 1.00 mg/dL   eGFR 79 >40 fO/fpw/8.26   BUN/Creatinine Ratio 8 (L) 12 - 28   Sodium 144 134 - 144 mmol/L   Potassium 4.6 3.5 - 5.2 mmol/L   Chloride 103 96 - 106 mmol/L   CO2 21 20 - 29 mmol/L   Calcium 9.4 8.7 - 10.3 mg/dL   Total Protein 6.8 6.0 - 8.5 g/dL   Albumin 4.2 3.9 - 4.9 g/dL   Globulin, Total 2.6 1.5 - 4.5 g/dL   Bilirubin Total 0.4 0.0 - 1.2 mg/dL   Alkaline Phosphatase 108 44 - 121 IU/L   AST 119 (H) 0 - 40 IU/L   ALT 104 (H) 0 - 32 IU/L  Lipid Panel w/o Chol/HDL Ratio     Status: None   Collection Time: 01/03/24 10:25 AM  Result Value Ref Range   Cholesterol, Total 154 100 - 199 mg/dL   Triglycerides 894 0 - 149 mg/dL   HDL 60 >60 mg/dL   VLDL Cholesterol Cal 19 5 - 40 mg/dL   LDL Chol Calc (NIH) 75 0 - 99 mg/dL  CBC with Diff     Status: None   Collection Time:  01/03/24 10:25 AM  Result Value Ref Range   WBC 4.0 3.4 - 10.8 x10E3/uL   RBC 4.41 3.77 - 5.28 x10E6/uL   Hemoglobin 13.5 11.1 - 15.9 g/dL   Hematocrit 58.2 65.9 - 46.6 %   MCV 95 79 - 97 fL   MCH 30.6 26.6 - 33.0 pg   MCHC 32.4 31.5 - 35.7 g/dL   RDW 87.1 88.2 - 84.5 %   Platelets 159 150 - 450 x10E3/uL   Neutrophils 48 Not Estab. %   Lymphs 32 Not Estab. %   Monocytes 18 Not Estab. %   Eos 1 Not Estab. %   Basos 1 Not Estab. %   Neutrophils Absolute 2.0 1.4 - 7.0 x10E3/uL   Lymphocytes Absolute 1.3 0.7 - 3.1 x10E3/uL   Monocytes Absolute 0.7 0.1 - 0.9 x10E3/uL   EOS (ABSOLUTE) 0.1 0.0 - 0.4 x10E3/uL   Basophils Absolute 0.0 0.0 - 0.2 x10E3/uL   Immature Granulocytes 0 Not Estab. %   Immature Grans (Abs) 0.0 0.0 - 0.1 x10E3/uL  Hemoglobin A1c     Status: Abnormal   Collection Time: 01/03/24 10:25 AM  Result Value Ref Range   Hgb A1c MFr  Bld 6.1 (H) 4.8 - 5.6 %    Comment:          Prediabetes: 5.7 - 6.4          Diabetes: >6.4          Glycemic control for adults with diabetes: <7.0    Est. average glucose Bld gHb Est-mCnc 128 mg/dL      Assessment & Plan:  Continue meds. Abdominal ultrasound. Will repeat LFTs. Problem List Items Addressed This Visit     Depression   GERD (gastroesophageal reflux disease)   Relevant Medications   pantoprazole  (PROTONIX ) 40 MG tablet   T2DM (type 2 diabetes mellitus) (HCC)   Relevant Medications   Dulaglutide  (TRULICITY ) 3 MG/0.5ML SOAJ   Combined hyperlipidemia associated with type 2 diabetes mellitus (HCC)   Relevant Medications   Dulaglutide  (TRULICITY ) 3 MG/0.5ML SOAJ   Hypertension associated with diabetes (HCC)   Relevant Medications   Dulaglutide  (TRULICITY ) 3 MG/0.5ML SOAJ   Other Visit Diagnoses       Pain of upper abdomen    -  Primary   Relevant Orders   Lipase   US  Abdomen Complete       Return in about 10 days (around 01/17/2024).   Total time spent: 30 minutes  FERNAND FREDY RAMAN, MD  01/07/2024   This  document may have been prepared by [facility] Voice Recognition software and as such may include unintentional dictation errors.

## 2024-01-08 ENCOUNTER — Ambulatory Visit
Admission: RE | Admit: 2024-01-08 | Discharge: 2024-01-08 | Disposition: A | Discharge status: Home / Self Care | Source: Ambulatory Visit | Attending: Internal Medicine | Admitting: Internal Medicine

## 2024-01-08 DIAGNOSIS — R1010 Upper abdominal pain, unspecified: Secondary | ICD-10-CM | POA: Insufficient documentation

## 2024-01-08 DIAGNOSIS — K8020 Calculus of gallbladder without cholecystitis without obstruction: Secondary | ICD-10-CM | POA: Diagnosis not present

## 2024-01-08 LAB — LIPASE: Lipase: 88 U/L — ABNORMAL HIGH (ref 14–72)

## 2024-01-08 LAB — SPECIMEN STATUS REPORT

## 2024-01-10 NOTE — Progress Notes (Signed)
 Patient notified

## 2024-01-13 ENCOUNTER — Encounter: Payer: Self-pay | Admitting: Internal Medicine

## 2024-01-14 ENCOUNTER — Ambulatory Visit: Payer: Self-pay | Admitting: Internal Medicine

## 2024-01-14 ENCOUNTER — Ambulatory Visit: Payer: Self-pay

## 2024-01-14 DIAGNOSIS — K8020 Calculus of gallbladder without cholecystitis without obstruction: Secondary | ICD-10-CM

## 2024-01-14 DIAGNOSIS — R1011 Right upper quadrant pain: Secondary | ICD-10-CM

## 2024-01-14 DIAGNOSIS — F331 Major depressive disorder, recurrent, moderate: Secondary | ICD-10-CM | POA: Diagnosis not present

## 2024-01-24 ENCOUNTER — Ambulatory Visit: Admitting: Internal Medicine

## 2024-01-27 ENCOUNTER — Encounter
Admission: RE | Admit: 2024-01-27 | Discharge: 2024-01-27 | Disposition: A | Discharge status: Home / Self Care | Source: Ambulatory Visit | Attending: Internal Medicine | Admitting: Internal Medicine

## 2024-01-27 DIAGNOSIS — K8020 Calculus of gallbladder without cholecystitis without obstruction: Secondary | ICD-10-CM | POA: Diagnosis not present

## 2024-01-27 DIAGNOSIS — R109 Unspecified abdominal pain: Secondary | ICD-10-CM | POA: Diagnosis not present

## 2024-01-27 MED ORDER — TECHNETIUM TC 99M MEBROFENIN IV KIT
5.4700 | PACK | Freq: Once | INTRAVENOUS | Status: AC | PRN
Start: 2024-01-27 — End: 2024-01-27
  Administered 2024-01-27: 5.47 via INTRAVENOUS

## 2024-01-28 ENCOUNTER — Ambulatory Visit: Payer: Self-pay | Admitting: Internal Medicine

## 2024-01-30 NOTE — Progress Notes (Signed)
 Patient notified

## 2024-02-03 NOTE — Progress Notes (Unsigned)
 Patient ID: Sandra Le, female   DOB: 07-16-1960, 63 y.o.   MRN: [id number]  Chief Complaint: Gallstones  History of Present Illness Sandra Le is a 63 y.o. female with history of gastric bypass surgery over a decade ago.  Known gallstone, currently measured at 8 mm.  HIDA scan negative for obstruction, with good function.  Since she stopped her Trulicity  she is actually feeling better now.  Uncertain as far as what kind of foods may provoke the pain, most of her pain is been midline/epigastric.  She has some loose stools at times, some days multiple. She otherwise denies nausea, vomiting, fevers or chills.  She is looking forward to a GI evaluation, she is due for colonoscopy and possible upper endoscopy.  She currently has a number of items on her agenda that she wishes to ensure that any elective procedure will not conflict with.  And is reluctant to consider elective surgery scheduling at this time. She has no history of jaundice, hepatitis, acholic stools, melena or hematochezia.  Past Medical History Past Medical History:  Diagnosis Date   Basal cell carcinoma 2024   L shoulder, txted at [facility]   Depression    Diabetes mellitus without complication (HCC)    Hyperlipidemia    Hypertension    Plantar fasciitis       Past Surgical History:  Procedure Laterality Date   BARIATRIC SURGERY     CESAREAN SECTION      Allergies  Allergen Reactions   Macrobid  [Nitrofurantoin ] Nausea Only    Current Outpatient Medications  Medication Sig Dispense Refill   acetaminophen (TYLENOL) 500 MG tablet Take by mouth.     amLODipine  (NORVASC ) 5 MG tablet Take 1 tablet (5 mg total) by mouth daily. 90 tablet 3   b complex vitamins capsule Take 1 capsule by mouth daily.     Cholecalciferol (VITAMIN D3) 50 MCG (2000 UT) capsule Take 2,000 Units by mouth daily.     cloNIDine  (CATAPRES ) 0.3 MG tablet Take 1 tablet (0.3 mg total) by mouth at bedtime. (Patient taking differently: Take  0.15 mg by mouth at bedtime.) 90 tablet 3   DULoxetine (CYMBALTA) 60 MG capsule   5   glimepiride  (AMARYL ) 2 MG tablet Take 1 tablet (2 mg total) by mouth daily with breakfast. 90 tablet 3   hydrOXYzine (ATARAX) 50 MG tablet Take 50 mg by mouth every 8 (eight) hours as needed for anxiety.     ketoconazole  (NIZORAL ) 2 % cream Apply 1 Application topically as directed. every day/bid to body folds for itchy rash 60 g 5   Multiple Vitamin (MULTIVITAMIN) capsule Take by mouth.     pantoprazole  (PROTONIX ) 40 MG tablet Take 1 tablet (40 mg total) by mouth daily. 30 tablet 1   QUEtiapine (SEROQUEL) 25 MG tablet Take 25 mg by mouth at bedtime.     sertraline (ZOLOFT) 100 MG tablet Take 100 mg by mouth daily.     simvastatin  (ZOCOR ) 20 MG tablet TAKE ONE TABLET BY MOUTH EVERY DAY (Patient taking differently: Take 10 mg by mouth daily.) 90 tablet 3   tacrolimus  (PROTOPIC ) 0.1 % ointment Apply topically as directed. qd to bid to aa rash lower abdomen and under breast prn flares 100 g 1   Turmeric 400 MG CAPS Take by mouth.     No current facility-administered medications for this visit.    Family History Family History  Problem Relation Age of Onset   Arthritis Mother    Cancer  Father        Melanoma   Diabetes Father    Cancer Brother        Throat   Arthritis Paternal Grandmother    Breast cancer Neg Hx    Bladder Cancer Neg Hx    Kidney cancer Neg Hx       Social History Social History   Tobacco Use   Smoking status: Former   Smokeless tobacco: Never  Vaping Use   Vaping status: Never Used  Substance Use Topics   Alcohol use: Yes        Review of Systems  Constitutional: Negative.   HENT: Negative.    Eyes: Negative.   Respiratory: Negative.    Cardiovascular: Negative.   Gastrointestinal:  Positive for abdominal pain and diarrhea.  Genitourinary: Negative.   Skin:  Positive for itching.  Neurological: Negative.   Psychiatric/Behavioral:  Positive for depression. The  patient is nervous/anxious.      Physical Exam Blood pressure 137/87, pulse 66, temperature 98.3 F (36.8 C), temperature source Oral, height 5' 5 (1.651 m), weight 212 lb (96.2 kg), SpO2 98%. Last Weight  Most recent update: 02/04/2024 10:47 AM    Weight  96.2 kg (212 lb)             CONSTITUTIONAL: Well developed, and nourished, appropriately responsive and aware without distress.   EYES: Sclera non-icteric.   EARS, NOSE, MOUTH AND THROAT:  The oropharynx is clear. Oral mucosa is pink and moist.    Hearing is intact to voice.  NECK: Trachea is midline, and there is no jugular venous distension.  LYMPH NODES:  Lymph nodes in the neck are not appreciated. RESPIRATORY:  Lungs are clear, and breath sounds are equal bilaterally.  Normal respiratory effort without pathologic use of accessory muscles. CARDIOVASCULAR: Heart is regular in rate and rhythm.   Well perfused.  GI: The abdomen is well-healed laparoscopic scars without appreciable mass or fascial defect.  Otherwise soft, nontender, and nondistended. There were no palpable masses.  I did not appreciate hepatosplenomegaly.  MUSCULOSKELETAL:  Symmetrical muscle tone appreciated in all four extremities.    SKIN: Skin turgor is normal. No pathologic skin lesions appreciated.  NEUROLOGIC:  Motor and sensation appear grossly normal.  Cranial nerves are grossly without defect. PSYCH:  Alert and oriented to person, place and time. Affect is appropriate for situation.  Data Reviewed I have personally reviewed what is currently available of the patient's imaging, recent labs and medical records.   Labs:     Latest Ref Rng & Units 01/03/2024   10:25 AM 09/24/2023    8:31 AM 09/10/2022    1:05 PM  CBC  WBC 3.4 - 10.8 x10E3/uL 4.0  5.6  5.1   Hemoglobin 11.1 - 15.9 g/dL 86.4  86.5  86.0   Hematocrit 34.0 - 46.6 % 41.7  40.5  40.6   Platelets 150 - 450 x10E3/uL 159  128        Latest Ref Rng & Units 01/03/2024   10:25 AM 09/24/2023     8:31 AM 06/28/2023    9:47 AM  CMP  Glucose 70 - 99 mg/dL 81  98  896   BUN 8 - 27 mg/dL 7  11  12    Creatinine 0.57 - 1.00 mg/dL 9.16  9.15  9.08   Sodium 134 - 144 mmol/L 144  140  143   Potassium 3.5 - 5.2 mmol/L 4.6  4.4  4.6   Chloride 96 - 106  mmol/L 103  102  103   CO2 20 - 29 mmol/L 21  24  24    Calcium 8.7 - 10.3 mg/dL 9.4  9.4  9.5   Total Protein 6.0 - 8.5 g/dL 6.8  6.6  6.7   Total Bilirubin 0.0 - 1.2 mg/dL 0.4  0.5  0.6   Alkaline Phos 44 - 121 IU/L 108  83  76   AST 0 - 40 IU/L 119  21  27   ALT 0 - 32 IU/L 104  18  18     Imaging: Radiological images personally reviewed:  CLINICAL DATA:  Cholelithiasis, abdominal pain x3 weeks constantly.   EXAM: NUCLEAR MEDICINE HEPATOBILIARY IMAGING WITH GALLBLADDER EF   TECHNIQUE: Sequential images of the abdomen were obtained out to 60 minutes following intravenous administration of radiopharmaceutical. After oral ingestion of Ensure, gallbladder ejection fraction was determined. At 60 min, normal ejection fraction is greater than 33%.   RADIOPHARMACEUTICALS:  5.47 mCi Tc-57m  Choletec  IV   COMPARISON:  Ultrasound January 08, 2024   FINDINGS: Prompt uptake and biliary excretion of activity by the liver is seen. Gallbladder activity is visualized, consistent with patency of cystic duct. Biliary activity passes into small bowel, consistent with patent common bile duct.   Calculated gallbladder ejection fraction is 76%. (Normal gallbladder ejection fraction with Ensure is greater than 33% and less than 80%.)   IMPRESSION: 1.  Patent cystic and common bile ducts.   2.  Normal gallbladder ejection fraction.     Electronically Signed   By: Reyes Holder M.D.   On: 01/28/2024 13:52  CLINICAL DATA:  Upper abdomen pain for months.   EXAM: ABDOMEN ULTRASOUND COMPLETE   COMPARISON:  CT abdomen and May 05, 2018.   FINDINGS: Gallbladder: 4 mm polyp. Gallstones are noted, largest measures 8 mm. No wall  thickening visualized. No sonographic Murphy sign noted by sonographer.   Common bile duct: Diameter: 3.3 mm.   Liver: No focal lesion identified. Within normal limits in parenchymal echogenicity. Portal vein is patent on color Doppler imaging with normal direction of blood flow towards the liver.   IVC: No abnormality visualized.   Pancreas: Not well visualized per ultrasound technologist.   Spleen: Size and appearance within normal limits.   Right Kidney: Length: 10.3 cm. Echogenicity within normal limits. No mass or hydronephrosis visualized.   Left Kidney: Length: 10.7 cm. Echogenicity within normal limits. No mass or hydronephrosis visualized.   Abdominal aorta: No aneurysm visualized.   Other findings: None.   IMPRESSION: 1. Cholelithiasis without sonographic evidence of acute cholecystitis.     Electronically Signed   By: Craig Farr M.D.   On: 01/14/2024 14:25  Within last 24 hrs: No results found.  Assessment    Chronic calculus cholecystitis with biliary colic  Patient Active Problem List   Diagnosis Date Noted   GAD (generalized anxiety disorder) 09/23/2023   Hypertension associated with diabetes (HCC) 07/01/2023   Combined hyperlipidemia associated with type 2 diabetes mellitus (HCC) 03/28/2023   Vitamin D  deficiency 12/25/2022   Vitamin B12 deficiency 12/25/2022   Acute vaginitis 11/12/2022   Recurrent sinus infections 10/02/2022   Rash 09/11/2022   Nonrheumatic mitral valve regurgitation 09/11/2022   Non-seasonal allergic rhinitis 07/24/2022   Depression 02/10/2018   GERD (gastroesophageal reflux disease) 02/10/2018   Essential hypertension, benign 02/10/2018   Hyperlipemia 02/10/2018   Menopausal and perimenopausal disorder 02/10/2018   Morbid obesity (HCC) 02/10/2018   T2DM (type 2 diabetes mellitus) (HCC) 02/10/2018  OSA (obstructive sleep apnea) 02/10/2018   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   Optimal plan is for elective robotic cholecystectomy utilizing ICG imaging. Risks and benefits have been discussed with the patient which include but are not limited to anesthesia, bleeding, infection, biliary ductal injury, resulting in leak or stenosis, other associated unanticipated injuries affiliated with laparoscopic surgery.   Reviewed that removing the gallbladder will only address the symptoms related to the gallbladder itself.  I believe there is the desire to proceed, accepting the risks with understanding.  Questions elicited and answered to satisfaction.    No guarantees ever expressed or implied.  She would prefer to determine the best timing for proceeding with her operation, I believe she would like to have a full GI evaluation prior to this elective procedure, and also considers early December the least likely to interrupt any of her current plans.  I did discuss the desire that elective is much better for her, then an urgent procedure.  And I think she is weighing this.  I did make her aware that I would no longer be at this practice in November, however I ensured her that we have excellent surgeons that can follow her to & through her operation.   I personally spent a total of 45 minutes in the care of the patient today including preparing to see the patient, getting/reviewing separately obtained history, performing a medically appropriate exam/evaluation, counseling and educating, documenting clinical information in the EHR, and independently interpreting results.   These notes generated with voice recognition software. I apologize for typographical errors.  [name] M.D., FACS 02/04/2024, 11:31 AM

## 2024-02-03 NOTE — H&P (View-Only) (Signed)
 Patient ID: Sandra Le, female   DOB: 07-16-1960, 63 y.o.   MRN: [id number]  Chief Complaint: Gallstones  History of Present Illness Sandra Le is a 63 y.o. female with history of gastric bypass surgery over a decade ago.  Known gallstone, currently measured at 8 mm.  HIDA scan negative for obstruction, with good function.  Since she stopped her Trulicity  she is actually feeling better now.  Uncertain as far as what kind of foods may provoke the pain, most of her pain is been midline/epigastric.  She has some loose stools at times, some days multiple. She otherwise denies nausea, vomiting, fevers or chills.  She is looking forward to a GI evaluation, she is due for colonoscopy and possible upper endoscopy.  She currently has a number of items on her agenda that she wishes to ensure that any elective procedure will not conflict with.  And is reluctant to consider elective surgery scheduling at this time. She has no history of jaundice, hepatitis, acholic stools, melena or hematochezia.  Past Medical History Past Medical History:  Diagnosis Date   Basal cell carcinoma 2024   L shoulder, txted at [facility]   Depression    Diabetes mellitus without complication (HCC)    Hyperlipidemia    Hypertension    Plantar fasciitis       Past Surgical History:  Procedure Laterality Date   BARIATRIC SURGERY     CESAREAN SECTION      Allergies  Allergen Reactions   Macrobid  [Nitrofurantoin ] Nausea Only    Current Outpatient Medications  Medication Sig Dispense Refill   acetaminophen (TYLENOL) 500 MG tablet Take by mouth.     amLODipine  (NORVASC ) 5 MG tablet Take 1 tablet (5 mg total) by mouth daily. 90 tablet 3   b complex vitamins capsule Take 1 capsule by mouth daily.     Cholecalciferol (VITAMIN D3) 50 MCG (2000 UT) capsule Take 2,000 Units by mouth daily.     cloNIDine  (CATAPRES ) 0.3 MG tablet Take 1 tablet (0.3 mg total) by mouth at bedtime. (Patient taking differently: Take  0.15 mg by mouth at bedtime.) 90 tablet 3   DULoxetine (CYMBALTA) 60 MG capsule   5   glimepiride  (AMARYL ) 2 MG tablet Take 1 tablet (2 mg total) by mouth daily with breakfast. 90 tablet 3   hydrOXYzine (ATARAX) 50 MG tablet Take 50 mg by mouth every 8 (eight) hours as needed for anxiety.     ketoconazole  (NIZORAL ) 2 % cream Apply 1 Application topically as directed. every day/bid to body folds for itchy rash 60 g 5   Multiple Vitamin (MULTIVITAMIN) capsule Take by mouth.     pantoprazole  (PROTONIX ) 40 MG tablet Take 1 tablet (40 mg total) by mouth daily. 30 tablet 1   QUEtiapine (SEROQUEL) 25 MG tablet Take 25 mg by mouth at bedtime.     sertraline (ZOLOFT) 100 MG tablet Take 100 mg by mouth daily.     simvastatin  (ZOCOR ) 20 MG tablet TAKE ONE TABLET BY MOUTH EVERY DAY (Patient taking differently: Take 10 mg by mouth daily.) 90 tablet 3   tacrolimus  (PROTOPIC ) 0.1 % ointment Apply topically as directed. qd to bid to aa rash lower abdomen and under breast prn flares 100 g 1   Turmeric 400 MG CAPS Take by mouth.     No current facility-administered medications for this visit.    Family History Family History  Problem Relation Age of Onset   Arthritis Mother    Cancer  Father        Melanoma   Diabetes Father    Cancer Brother        Throat   Arthritis Paternal Grandmother    Breast cancer Neg Hx    Bladder Cancer Neg Hx    Kidney cancer Neg Hx       Social History Social History   Tobacco Use   Smoking status: Former   Smokeless tobacco: Never  Vaping Use   Vaping status: Never Used  Substance Use Topics   Alcohol use: Yes        Review of Systems  Constitutional: Negative.   HENT: Negative.    Eyes: Negative.   Respiratory: Negative.    Cardiovascular: Negative.   Gastrointestinal:  Positive for abdominal pain and diarrhea.  Genitourinary: Negative.   Skin:  Positive for itching.  Neurological: Negative.   Psychiatric/Behavioral:  Positive for depression. The  patient is nervous/anxious.      Physical Exam Blood pressure 137/87, pulse 66, temperature 98.3 F (36.8 C), temperature source Oral, height 5' 5 (1.651 m), weight 212 lb (96.2 kg), SpO2 98%. Last Weight  Most recent update: 02/04/2024 10:47 AM    Weight  96.2 kg (212 lb)             CONSTITUTIONAL: Well developed, and nourished, appropriately responsive and aware without distress.   EYES: Sclera non-icteric.   EARS, NOSE, MOUTH AND THROAT:  The oropharynx is clear. Oral mucosa is pink and moist.    Hearing is intact to voice.  NECK: Trachea is midline, and there is no jugular venous distension.  LYMPH NODES:  Lymph nodes in the neck are not appreciated. RESPIRATORY:  Lungs are clear, and breath sounds are equal bilaterally.  Normal respiratory effort without pathologic use of accessory muscles. CARDIOVASCULAR: Heart is regular in rate and rhythm.   Well perfused.  GI: The abdomen is well-healed laparoscopic scars without appreciable mass or fascial defect.  Otherwise soft, nontender, and nondistended. There were no palpable masses.  I did not appreciate hepatosplenomegaly.  MUSCULOSKELETAL:  Symmetrical muscle tone appreciated in all four extremities.    SKIN: Skin turgor is normal. No pathologic skin lesions appreciated.  NEUROLOGIC:  Motor and sensation appear grossly normal.  Cranial nerves are grossly without defect. PSYCH:  Alert and oriented to person, place and time. Affect is appropriate for situation.  Data Reviewed I have personally reviewed what is currently available of the patient's imaging, recent labs and medical records.   Labs:     Latest Ref Rng & Units 01/03/2024   10:25 AM 09/24/2023    8:31 AM 09/10/2022    1:05 PM  CBC  WBC 3.4 - 10.8 x10E3/uL 4.0  5.6  5.1   Hemoglobin 11.1 - 15.9 g/dL 86.4  86.5  86.0   Hematocrit 34.0 - 46.6 % 41.7  40.5  40.6   Platelets 150 - 450 x10E3/uL 159  128        Latest Ref Rng & Units 01/03/2024   10:25 AM 09/24/2023     8:31 AM 06/28/2023    9:47 AM  CMP  Glucose 70 - 99 mg/dL 81  98  896   BUN 8 - 27 mg/dL 7  11  12    Creatinine 0.57 - 1.00 mg/dL 9.16  9.15  9.08   Sodium 134 - 144 mmol/L 144  140  143   Potassium 3.5 - 5.2 mmol/L 4.6  4.4  4.6   Chloride 96 - 106  mmol/L 103  102  103   CO2 20 - 29 mmol/L 21  24  24    Calcium 8.7 - 10.3 mg/dL 9.4  9.4  9.5   Total Protein 6.0 - 8.5 g/dL 6.8  6.6  6.7   Total Bilirubin 0.0 - 1.2 mg/dL 0.4  0.5  0.6   Alkaline Phos 44 - 121 IU/L 108  83  76   AST 0 - 40 IU/L 119  21  27   ALT 0 - 32 IU/L 104  18  18     Imaging: Radiological images personally reviewed:  CLINICAL DATA:  Cholelithiasis, abdominal pain x3 weeks constantly.   EXAM: NUCLEAR MEDICINE HEPATOBILIARY IMAGING WITH GALLBLADDER EF   TECHNIQUE: Sequential images of the abdomen were obtained out to 60 minutes following intravenous administration of radiopharmaceutical. After oral ingestion of Ensure, gallbladder ejection fraction was determined. At 60 min, normal ejection fraction is greater than 33%.   RADIOPHARMACEUTICALS:  5.47 mCi Tc-57m  Choletec  IV   COMPARISON:  Ultrasound January 08, 2024   FINDINGS: Prompt uptake and biliary excretion of activity by the liver is seen. Gallbladder activity is visualized, consistent with patency of cystic duct. Biliary activity passes into small bowel, consistent with patent common bile duct.   Calculated gallbladder ejection fraction is 76%. (Normal gallbladder ejection fraction with Ensure is greater than 33% and less than 80%.)   IMPRESSION: 1.  Patent cystic and common bile ducts.   2.  Normal gallbladder ejection fraction.     Electronically Signed   By: Reyes Holder M.D.   On: 01/28/2024 13:52  CLINICAL DATA:  Upper abdomen pain for months.   EXAM: ABDOMEN ULTRASOUND COMPLETE   COMPARISON:  CT abdomen and May 05, 2018.   FINDINGS: Gallbladder: 4 mm polyp. Gallstones are noted, largest measures 8 mm. No wall  thickening visualized. No sonographic Murphy sign noted by sonographer.   Common bile duct: Diameter: 3.3 mm.   Liver: No focal lesion identified. Within normal limits in parenchymal echogenicity. Portal vein is patent on color Doppler imaging with normal direction of blood flow towards the liver.   IVC: No abnormality visualized.   Pancreas: Not well visualized per ultrasound technologist.   Spleen: Size and appearance within normal limits.   Right Kidney: Length: 10.3 cm. Echogenicity within normal limits. No mass or hydronephrosis visualized.   Left Kidney: Length: 10.7 cm. Echogenicity within normal limits. No mass or hydronephrosis visualized.   Abdominal aorta: No aneurysm visualized.   Other findings: None.   IMPRESSION: 1. Cholelithiasis without sonographic evidence of acute cholecystitis.     Electronically Signed   By: Craig Farr M.D.   On: 01/14/2024 14:25  Within last 24 hrs: No results found.  Assessment    Chronic calculus cholecystitis with biliary colic  Patient Active Problem List   Diagnosis Date Noted   GAD (generalized anxiety disorder) 09/23/2023   Hypertension associated with diabetes (HCC) 07/01/2023   Combined hyperlipidemia associated with type 2 diabetes mellitus (HCC) 03/28/2023   Vitamin D  deficiency 12/25/2022   Vitamin B12 deficiency 12/25/2022   Acute vaginitis 11/12/2022   Recurrent sinus infections 10/02/2022   Rash 09/11/2022   Nonrheumatic mitral valve regurgitation 09/11/2022   Non-seasonal allergic rhinitis 07/24/2022   Depression 02/10/2018   GERD (gastroesophageal reflux disease) 02/10/2018   Essential hypertension, benign 02/10/2018   Hyperlipemia 02/10/2018   Menopausal and perimenopausal disorder 02/10/2018   Morbid obesity (HCC) 02/10/2018   T2DM (type 2 diabetes mellitus) (HCC) 02/10/2018  OSA (obstructive sleep apnea) 02/10/2018   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   Optimal plan is for elective robotic cholecystectomy utilizing ICG imaging. Risks and benefits have been discussed with the patient which include but are not limited to anesthesia, bleeding, infection, biliary ductal injury, resulting in leak or stenosis, other associated unanticipated injuries affiliated with laparoscopic surgery.   Reviewed that removing the gallbladder will only address the symptoms related to the gallbladder itself.  I believe there is the desire to proceed, accepting the risks with understanding.  Questions elicited and answered to satisfaction.    No guarantees ever expressed or implied.  She would prefer to determine the best timing for proceeding with her operation, I believe she would like to have a full GI evaluation prior to this elective procedure, and also considers early December the least likely to interrupt any of her current plans.  I did discuss the desire that elective is much better for her, then an urgent procedure.  And I think she is weighing this.  I did make her aware that I would no longer be at this practice in November, however I ensured her that we have excellent surgeons that can follow her to & through her operation.   I personally spent a total of 45 minutes in the care of the patient today including preparing to see the patient, getting/reviewing separately obtained history, performing a medically appropriate exam/evaluation, counseling and educating, documenting clinical information in the EHR, and independently interpreting results.   These notes generated with voice recognition software. I apologize for typographical errors.  [name] M.D., FACS 02/04/2024, 11:31 AM

## 2024-02-04 ENCOUNTER — Ambulatory Visit: Admitting: Surgery

## 2024-02-04 ENCOUNTER — Encounter: Payer: Self-pay | Admitting: Surgery

## 2024-02-04 VITALS — BP 137/87 | HR 66 | Temp 98.3°F | Ht 65.0 in | Wt 212.0 lb

## 2024-02-04 DIAGNOSIS — E66812 Obesity, class 2: Secondary | ICD-10-CM | POA: Insufficient documentation

## 2024-02-04 DIAGNOSIS — K8010 Calculus of gallbladder with chronic cholecystitis without obstruction: Secondary | ICD-10-CM

## 2024-02-04 DIAGNOSIS — Z6835 Body mass index (BMI) 35.0-35.9, adult: Secondary | ICD-10-CM | POA: Diagnosis not present

## 2024-02-04 HISTORY — DX: Calculus of gallbladder with chronic cholecystitis without obstruction: K80.10

## 2024-02-04 NOTE — Patient Instructions (Addendum)
 Please give us  a call when you are ready to schedule surgery, you will need to make another appointment if its over 30 days of being seen.   If you have any questions or concerns please give us  a call   Cholelithiasis  Cholelithiasis happens when gallstones form in the gallbladder. The gallbladder stores bile. Bile is a fluid that helps digest fats. Bile can harden and form into gallstones. If they cause a blockage, they can cause pain (gallbladder attack). What are the causes? This condition may be caused by: Too much bilirubin in the bile. This happens if you have sickle cell anemia. Too much of a fat-like substance (cholesterol) in your bile. Not enough bile salts in your bile. These salts help the body absorb and digest fats. The gallbladder not emptying fully or often enough. This is common in pregnant women. What increases the risk? The following factors may make you more likely to develop this condition: Being older than age 10. Eating a lot of fried foods, fat, and refined carbs (refined carbohydrates). Being female. Being pregnant many times. Using medicines with female hormones in them for a long time. Losing weight fast. Having gallstones in your family. Having health problems, such as diabetes, obesity, Crohn's disease, or liver disease. What are the signs or symptoms? Often, there may be gallstones but no symptoms. These gallstones are called silent gallstones. If a gallstone causes a blockage, you may get sudden pain. The pain: Can be in the upper right part of your belly (abdomen). Normally comes at night or after you eat. Can last an hour or more. Can spread to your right shoulder, back, or chest. Can feel like discomfort, burning, or fullness in the upper part of your belly (indigestion). If the blockage lasts more than a few hours, you can get an infection or swelling. You may: Vomit or feel like you may vomit (nauseous). Feel bloated. Have belly pain for 5 hours or  more. Feel tender in your belly, often in the upper right part and under your ribs. Have a fever or chills. Have skin or the white parts of your eyes turn yellow (jaundice). Have dark pee (urine) or pale poop (stool). How is this treated? Treatment for this condition depends on how bad you feel. If you have symptoms, you may need: Home care, if symptoms are not very bad. Do not eat for 12-24 hours. Drink only water and clear liquids. After 1 or 2 days, start to eat simple or clear foods. Try broth and crackers. You may need medicines for pain or stomach upset or both. If you have an infection, you will need antibiotics. A hospital stay, if you have very bad pain or a very bad infection. Surgery to remove your gallbladder. You may need this if: Gallstones keep coming back. You have very bad symptoms. Medicines to break up gallstones. Medicines may be used for 6-12 months. A procedure to find and take out gallstones or to break up gallstones. Follow these instructions at home: Medicines Take over-the-counter and prescription medicines only as told by your doctor. If you were prescribed antibiotics, take them as told by your doctor. Do not stop taking them even if you start to feel better. Ask your doctor if you should avoid driving or using machines while you are taking your medicine. Eating and drinking Drink enough fluid to keep your pee pale yellow. Drink water or clear fluids. This is important when you have pain. Eat healthy foods. Choose: Fewer fatty foods,  such as fried foods. Fewer refined carbs. Avoid breads and grains that are highly processed, such as white bread and white rice. Choose whole grains, such as whole-wheat bread and brown rice. More fiber. Almonds, fresh fruit, and beans are healthy sources. General instructions Keep a healthy weight. Keep all follow-up visits. You may need to see a specialist or a Careers adviser. Where to find more information [employer] of  Diabetes and Digestive and Kidney Diseases: [email or web address] Contact a doctor if: You have sudden pain in the upper right part of your belly. Pain might spread to your right shoulder, back, or chest. Your pain lasts more than 2 hours. You have been diagnosed with gallstones that have no symptoms and you get: Belly pain. Discomfort, burning, or fullness in the upper part of your abdomen. You keep feeling like you may vomit. You have dark pee or pale poop. Get help right away if: You have pain in your abdomen, that: Lasts more than 5 hours. Keeps getting worse. You have a fever or chills. You can't stop vomiting. Your skin or the white parts of your eyes turn yellow. This information is not intended to replace advice given to you by your health care provider. Make sure you discuss any questions you have with your health care provider. Document Revised: 02/19/2022 Document Reviewed: 02/19/2022 Elsevier Patient Education  2024 [employer].

## 2024-02-06 ENCOUNTER — Ambulatory Visit: Payer: Self-pay | Admitting: Surgery

## 2024-02-06 DIAGNOSIS — K8010 Calculus of gallbladder with chronic cholecystitis without obstruction: Secondary | ICD-10-CM

## 2024-02-07 ENCOUNTER — Telehealth: Payer: Self-pay | Admitting: Surgery

## 2024-02-07 NOTE — Telephone Encounter (Signed)
 Patient has been advised of Pre-Admission date/time, and Surgery date at [facility].  Surgery Date: 03/04/24 Preadmission Testing Date: 02/26/24 (phone 8a-1p)  Patient informed of the scheduling process and surgery information given at time of office visit.   Patient has been made aware to call [phone], between 1-3:00pm the day before surgery, to find out what time to arrive for surgery.

## 2024-02-12 ENCOUNTER — Encounter: Payer: Self-pay | Admitting: Dermatology

## 2024-02-17 ENCOUNTER — Ambulatory Visit: Admitting: Dermatology

## 2024-02-17 DIAGNOSIS — I8393 Asymptomatic varicose veins of bilateral lower extremities: Secondary | ICD-10-CM

## 2024-02-17 DIAGNOSIS — L814 Other melanin hyperpigmentation: Secondary | ICD-10-CM | POA: Diagnosis not present

## 2024-02-17 DIAGNOSIS — L820 Inflamed seborrheic keratosis: Secondary | ICD-10-CM | POA: Diagnosis not present

## 2024-02-17 NOTE — Patient Instructions (Addendum)

## 2024-02-17 NOTE — Progress Notes (Signed)
   Follow-Up Visit   Subjective  Sandra Le is a 63 y.o. female who presents for the following: Growth on the left lower leg, noticed 2-4 months, patient picks at and irritated by shaving.  The following portions of the chart were reviewed this encounter and updated as appropriate: medications, allergies, medical history  Review of Systems:  No other skin or systemic complaints except as noted in HPI or Assessment and Plan.  Objective  Well appearing patient in no apparent distress; mood and affect are within normal limits.  A focused examination was performed of the following areas: Left leg  Relevant physical exam findings are noted in the Assessment and Plan.  left inferior knee Pink scaly papule.   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  Symptomatic, irritating, patient would like treated. Destruction of lesion - left inferior knee  Destruction method: cryotherapy   Informed consent: discussed and consent obtained   Lesion destroyed using liquid nitrogen: Yes   Region frozen until ice ball extended beyond lesion: Yes   Outcome: patient tolerated procedure well with no complications   Post-procedure details: wound care instructions given   Additional details:  Prior to procedure, discussed risks of blister formation, small wound, skin dyspigmentation, or rare scar following cryotherapy. Recommend Vaseline ointment to treated areas while healing.    Varicose Veins/Spider Veins - Dilated blue, purple or red veins at the lower extremities - Reassured - Smaller vessels can be treated by sclerotherapy (a procedure to inject a medicine into the veins to make them disappear) if desired, but the treatment is not covered by insurance. Larger vessels may be covered if symptomatic and we would refer to vascular surgeon if treatment desired.  LENTIGINES Exam: scattered tan macules Due to sun exposure Treatment  Plan: Benign-appearing, observe. Recommend daily broad spectrum sunscreen SPF 30+ to sun-exposed areas, reapply every 2 hours as needed.  Call for any changes  Return as scheduled, for TBSE.  IAndrea Kerns, CMA, am acting as scribe for Rexene Rattler, MD .   Documentation: I have reviewed the above documentation for accuracy and completeness, and I agree with the above.  Rexene Rattler, MD

## 2024-02-18 DIAGNOSIS — R1319 Other dysphagia: Secondary | ICD-10-CM | POA: Diagnosis not present

## 2024-02-18 DIAGNOSIS — R748 Abnormal levels of other serum enzymes: Secondary | ICD-10-CM | POA: Diagnosis not present

## 2024-02-18 DIAGNOSIS — Z1211 Encounter for screening for malignant neoplasm of colon: Secondary | ICD-10-CM | POA: Diagnosis not present

## 2024-02-18 DIAGNOSIS — R1013 Epigastric pain: Secondary | ICD-10-CM | POA: Diagnosis not present

## 2024-02-19 NOTE — Addendum Note (Signed)
 Encounter addended by: Gladis Burnard SAILOR on: 02/19/2024 11:05 AM  Actions taken: Imaging Exam ended

## 2024-02-24 ENCOUNTER — Encounter: Payer: Self-pay | Admitting: *Deleted

## 2024-02-24 ENCOUNTER — Encounter: Payer: Self-pay | Admitting: Gastroenterology

## 2024-02-24 NOTE — Progress Notes (Signed)
 Sandra Le                                          MRN: [id number]   02/24/2024   The [facility] Quality Team Specialist reviewed this patient medical record for the purposes of chart review for care gap closure. The following were reviewed: abstraction for care gap closure-controlling blood pressure and diabetic eye exam.    [facility] Quality Team

## 2024-02-24 NOTE — Anesthesia Preprocedure Evaluation (Signed)
 Anesthesia Evaluation  Patient identified by MRN, date of birth, ID band Patient awake    Reviewed: Allergy & Precautions, NPO status , Patient's Chart, lab work & pertinent test results  Airway Mallampati: II  TM Distance: >3 FB Neck ROM: Full    Dental  (+) Edentulous Upper, Edentulous Lower   Pulmonary neg pulmonary ROS, former smoker   Pulmonary exam normal        Cardiovascular hypertension, +CHF  + dysrhythmias Atrial Fibrillation  Rhythm:Regular Rate:Normal     Neuro/Psych negative neurological ROS  negative psych ROS   GI/Hepatic Neg liver ROS, PUD,GERD  Medicated,,  Endo/Other  diabetes, Type 2, Oral Hypoglycemic Agents    Renal/GU   negative genitourinary   Musculoskeletal  (+) Arthritis ,    Abdominal Normal abdominal exam  (+)   Peds  Hematology  (+) Blood dyscrasia, anemia   Anesthesia Other Findings   Reproductive/Obstetrics                             Anesthesia Physical Anesthesia Plan  ASA: 3  Anesthesia Plan: MAC and Regional   Post-op Pain Management:    Induction: Intravenous  PONV Risk Score and Plan: 1 and Ondansetron, Dexamethasone, Propofol infusion and Treatment may vary due to age or medical condition  Airway Management Planned: Simple Face Mask, Natural Airway and Nasal Cannula  Additional Equipment: None  Intra-op Plan:   Post-operative Plan:   Informed Consent: I have reviewed the patients History and Physical, chart, labs and discussed the procedure including the risks, benefits and alternatives for the proposed anesthesia with the patient or authorized representative who has indicated his/her understanding and acceptance.     Dental advisory given  Plan Discussed with: CRNA  Anesthesia Plan Comments:        Anesthesia Quick Evaluation

## 2024-02-25 ENCOUNTER — Ambulatory Visit: Payer: Self-pay | Admitting: Anesthesiology

## 2024-02-25 ENCOUNTER — Other Ambulatory Visit: Payer: Self-pay

## 2024-02-25 ENCOUNTER — Encounter
Admission: RE | Disposition: A | Discharge status: Home / Self Care | Payer: Self-pay | Source: Home / Self Care | Attending: Gastroenterology

## 2024-02-25 ENCOUNTER — Encounter: Payer: Self-pay | Admitting: Gastroenterology

## 2024-02-25 ENCOUNTER — Encounter: Payer: Self-pay | Admitting: Anesthesiology

## 2024-02-25 ENCOUNTER — Ambulatory Visit
Admission: RE | Admit: 2024-02-25 | Discharge: 2024-02-25 | Disposition: A | Discharge status: Home / Self Care | Attending: Gastroenterology | Admitting: Gastroenterology

## 2024-02-25 DIAGNOSIS — Z1211 Encounter for screening for malignant neoplasm of colon: Secondary | ICD-10-CM | POA: Diagnosis not present

## 2024-02-25 DIAGNOSIS — Z833 Family history of diabetes mellitus: Secondary | ICD-10-CM | POA: Insufficient documentation

## 2024-02-25 DIAGNOSIS — G4733 Obstructive sleep apnea (adult) (pediatric): Secondary | ICD-10-CM | POA: Diagnosis not present

## 2024-02-25 DIAGNOSIS — K5730 Diverticulosis of large intestine without perforation or abscess without bleeding: Secondary | ICD-10-CM | POA: Diagnosis not present

## 2024-02-25 DIAGNOSIS — E785 Hyperlipidemia, unspecified: Secondary | ICD-10-CM | POA: Insufficient documentation

## 2024-02-25 DIAGNOSIS — Z86718 Personal history of other venous thrombosis and embolism: Secondary | ICD-10-CM | POA: Diagnosis not present

## 2024-02-25 DIAGNOSIS — Z79899 Other long term (current) drug therapy: Secondary | ICD-10-CM | POA: Diagnosis not present

## 2024-02-25 DIAGNOSIS — R1013 Epigastric pain: Secondary | ICD-10-CM | POA: Diagnosis present

## 2024-02-25 DIAGNOSIS — R1319 Other dysphagia: Secondary | ICD-10-CM | POA: Diagnosis not present

## 2024-02-25 DIAGNOSIS — Z87891 Personal history of nicotine dependence: Secondary | ICD-10-CM | POA: Insufficient documentation

## 2024-02-25 DIAGNOSIS — K2289 Other specified disease of esophagus: Secondary | ICD-10-CM | POA: Diagnosis not present

## 2024-02-25 DIAGNOSIS — E119 Type 2 diabetes mellitus without complications: Secondary | ICD-10-CM | POA: Insufficient documentation

## 2024-02-25 DIAGNOSIS — R1314 Dysphagia, pharyngoesophageal phase: Secondary | ICD-10-CM | POA: Diagnosis not present

## 2024-02-25 DIAGNOSIS — K2100 Gastro-esophageal reflux disease with esophagitis, without bleeding: Secondary | ICD-10-CM | POA: Diagnosis not present

## 2024-02-25 DIAGNOSIS — F32A Depression, unspecified: Secondary | ICD-10-CM | POA: Diagnosis not present

## 2024-02-25 DIAGNOSIS — Z6835 Body mass index (BMI) 35.0-35.9, adult: Secondary | ICD-10-CM | POA: Diagnosis not present

## 2024-02-25 DIAGNOSIS — Z980 Intestinal bypass and anastomosis status: Secondary | ICD-10-CM | POA: Diagnosis not present

## 2024-02-25 DIAGNOSIS — G4730 Sleep apnea, unspecified: Secondary | ICD-10-CM | POA: Insufficient documentation

## 2024-02-25 DIAGNOSIS — I10 Essential (primary) hypertension: Secondary | ICD-10-CM | POA: Diagnosis not present

## 2024-02-25 DIAGNOSIS — Z7984 Long term (current) use of oral hypoglycemic drugs: Secondary | ICD-10-CM | POA: Insufficient documentation

## 2024-02-25 HISTORY — PX: ESOPHAGOGASTRODUODENOSCOPY: SHX5428

## 2024-02-25 HISTORY — DX: Gastro-esophageal reflux disease without esophagitis: K21.9

## 2024-02-25 HISTORY — PX: COLONOSCOPY: SHX5424

## 2024-02-25 HISTORY — DX: Sleep apnea, unspecified: G47.30

## 2024-02-25 HISTORY — DX: Anxiety disorder, unspecified: F41.9

## 2024-02-25 HISTORY — DX: Cardiac murmur, unspecified: R01.1

## 2024-02-25 LAB — GLUCOSE, CAPILLARY: Glucose-Capillary: 157 mg/dL — ABNORMAL HIGH (ref 70–99)

## 2024-02-25 SURGERY — COLONOSCOPY
Anesthesia: General | Site: Rectum

## 2024-02-25 MED ORDER — LIDOCAINE HCL (CARDIAC) PF 100 MG/5ML IV SOSY
PREFILLED_SYRINGE | INTRAVENOUS | Status: DC | PRN
  Administered 2024-02-25: 100 mg via INTRAVENOUS

## 2024-02-25 MED ORDER — PROPOFOL 10 MG/ML IV BOLUS
INTRAVENOUS | Status: DC | PRN
  Administered 2024-02-25: 140 ug/kg/min via INTRAVENOUS
  Administered 2024-02-25: 150 mg via INTRAVENOUS

## 2024-02-25 MED ORDER — LACTATED RINGERS IV SOLN: INTRAVENOUS | Status: DC

## 2024-02-25 MED ORDER — STERILE WATER FOR IRRIGATION IR SOLN
Status: DC | PRN
  Administered 2024-02-25 (×2): 500 mL

## 2024-02-25 SURGICAL SUPPLY — 25 items
BALLN DILATOR ESOPH 8 10 CRE (MISCELLANEOUS) IMPLANT
BALLOON DILATOR 12-15 8 (BALLOONS) IMPLANT
BALLOON DILATOR 15-18 8 (BALLOONS) IMPLANT
BALLOON DILATOR CRE 0-12 8 (BALLOONS) IMPLANT
BLOCK BITE 60FR ADLT L/F GRN (MISCELLANEOUS) ×3 IMPLANT
CLIP HMST 235XBRD CATH ROT (MISCELLANEOUS) IMPLANT
ELECTRODE REM PT RTRN 9FT ADLT (ELECTROSURGICAL) IMPLANT
FORCEPS BIOP RAD 4 LRG CAP 4 (CUTTING FORCEPS) IMPLANT
FORCEPS ESCP3.2XJMB 240X2.8X (MISCELLANEOUS) IMPLANT
GAUZE SPONGE 4X4 12PLY STRL (GAUZE/BANDAGES/DRESSINGS) IMPLANT
GOWN CVR UNV OPN BCK APRN NK (MISCELLANEOUS) ×6 IMPLANT
INJECTOR VARIJECT VIN23 (MISCELLANEOUS) IMPLANT
KIT DEFENDO VALVE AND CONN (KITS) IMPLANT
KIT PRC NS LF DISP ENDO (KITS) ×3 IMPLANT
KIT PROCEDURE OLYMPUS (MISCELLANEOUS) ×3 IMPLANT
MANIFOLD NEPTUNE II (INSTRUMENTS) ×2 IMPLANT
MARKER SPOT ENDO TATTOO 5ML (MISCELLANEOUS) IMPLANT
PROBE APC STR FIRE (PROBE) IMPLANT
RETRIEVER NET PLAT FOOD (MISCELLANEOUS) IMPLANT
RETRIEVER NET ROTH 2.5X230 LF (MISCELLANEOUS) IMPLANT
SNARE COLD EXACTO (MISCELLANEOUS) IMPLANT
SYR INFLATION 60ML (SYRINGE) IMPLANT
TRAP ETRAP POLY (MISCELLANEOUS) IMPLANT
WATER STERILE IRR 250ML POUR (IV SOLUTION) ×4 IMPLANT
WIRE CRE 18-20MM 8CM F G (MISCELLANEOUS) IMPLANT

## 2024-02-25 NOTE — Op Note (Signed)
 [facility] Gastroenterology Patient Name: Sandra Le Procedure Date: 02/25/2024 11:31 AM MRN: [id number] Account #: [id number] Date of Birth: 18-Feb-1961 Admit Type: Outpatient Age: 63 Room: [facility] OR ROOM 01 Gender: Female Note Status: Finalized Instrument Name: Colonoscope [id number] Procedure:             Colonoscopy Indications:           Screening for colorectal malignant neoplasm, Last                         colonoscopy: June 2015 Providers:             Corinn Jess Brooklyn MD, MD Medicines:             General Anesthesia Complications:         No immediate complications. Estimated blood loss: None. Procedure:             Pre-Anesthesia Assessment:                        - Prior to the procedure, a History and Physical was                         performed, and patient medications and allergies were                         reviewed. The patient is competent. The risks and                         benefits of the procedure and the sedation options and                         risks were discussed with the patient. All questions                         were answered and informed consent was obtained.                         Patient identification and proposed procedure were                         verified by the physician, the nurse, the                         anesthesiologist, the anesthetist and the technician                         in the pre-procedure area in the procedure room in the                         endoscopy suite. Mental Status Examination: alert and                         oriented. Airway Examination: normal oropharyngeal                         airway and neck mobility. Respiratory Examination:                         clear to auscultation. CV Examination:  normal.                         Prophylactic Antibiotics: The patient does not require                         prophylactic antibiotics. Prior Anticoagulants: The                          patient has taken no anticoagulant or antiplatelet                         agents. ASA Grade Assessment: II - A patient with mild                         systemic disease. After reviewing the risks and                         benefits, the patient was deemed in satisfactory                         condition to undergo the procedure. The anesthesia                         plan was to use general anesthesia. Immediately prior                         to administration of medications, the patient was                         re-assessed for adequacy to receive sedatives. The                         heart rate, respiratory rate, oxygen saturations,                         blood pressure, adequacy of pulmonary ventilation, and                         response to care were monitored throughout the                         procedure. The physical status of the patient was                         re-assessed after the procedure.                        After obtaining informed consent, the colonoscope was                         passed under direct vision. Throughout the procedure,                         the patient's blood pressure, pulse, and oxygen                         saturations were monitored continuously. The  Colonoscope was introduced through the anus and                         advanced to the the cecum, identified by appendiceal                         orifice and ileocecal valve. The colonoscopy was                         performed with moderate difficulty due to multiple                         diverticula in the colon, significant looping and the                         patient's body habitus. Successful completion of the                         procedure was aided by applying abdominal pressure.                         The patient tolerated the procedure well. The quality                         of the bowel preparation was evaluated using the BBPS                          [facility] Bowel Preparation Scale) with scores of: Right                         Colon = 3, Transverse Colon = 3 and Left Colon = 3                         (entire mucosa seen well with no residual staining,                         small fragments of stool or opaque liquid). The total                         BBPS score equals 9. The ileocecal valve, appendiceal                         orifice, and rectum were photographed. Findings:      The perianal and digital rectal examinations were normal. Pertinent       negatives include normal sphincter tone and no palpable rectal lesions.      Multiple medium-mouthed diverticula were found in the recto-sigmoid       colon and sigmoid colon.      The retroflexed view of the distal rectum and anal verge was normal and       showed no anal or rectal abnormalities.      The exam was otherwise without abnormality. Impression:            - Diverticulosis in the recto-sigmoid colon and in the                         sigmoid colon.                        -  The distal rectum and anal verge are normal on                         retroflexion view.                        - The examination was otherwise normal.                        - No specimens collected. Recommendation:        - Discharge patient to home (with escort).                        - Resume previous diet today.                        - Continue present medications.                        - Repeat colonoscopy in 10 years for screening                         purposes. Procedure Code(s):     --- Professional ---                        H9878, Colorectal cancer screening; colonoscopy on                         individual not meeting criteria for high risk Diagnosis Code(s):     --- Professional ---                        Z12.11, Encounter for screening for malignant neoplasm                         of colon                        K57.30, Diverticulosis of large intestine without                          perforation or abscess without bleeding CPT copyright 2022 American Medical Association. All rights reserved. The codes documented in this report are preliminary and upon coder review may  be revised to meet current compliance requirements. Dr. Corinn Brooklyn Corinn Jess Brooklyn MD, MD 02/25/2024 12:14:19 PM This report has been signed electronically. Number of Addenda: 0 Note Initiated On: 02/25/2024 11:31 AM Scope Withdrawal Time: 0 hours 6 minutes 29 seconds  Total Procedure Duration: 0 hours 13 minutes 7 seconds  Estimated Blood Loss:  Estimated blood loss: none.      [facility]

## 2024-02-25 NOTE — Anesthesia Postprocedure Evaluation (Signed)
 Anesthesia Post Note  Patient: Sandra Le  Procedure(s) Performed: COLONOSCOPY (Rectum) EGD (ESOPHAGOGASTRODUODENOSCOPY) (Mouth)  Patient location during evaluation: PACU Anesthesia Type: General Level of consciousness: awake and alert Pain management: pain level controlled Vital Signs Assessment: post-procedure vital signs reviewed and stable Respiratory status: spontaneous breathing, nonlabored ventilation and respiratory function stable Cardiovascular status: blood pressure returned to baseline and stable Postop Assessment: no apparent nausea or vomiting Anesthetic complications: no   No notable events documented.   Last Vitals:  Vitals:   02/25/24 1217 02/25/24 1223  BP: (!) 117/58 130/77  Pulse: 61 (!) 58  Resp: 19 18  Temp: (!) 36.3 C (!) 36.3 C  SpO2: 97% 100%    Last Pain:  Vitals:   02/25/24 1223  TempSrc:   PainSc: 0-No pain                 [name]

## 2024-02-25 NOTE — Transfer of Care (Signed)
 Immediate Anesthesia Transfer of Care Note  Patient: Sandra Le  Procedure(s) Performed: COLONOSCOPY EGD (ESOPHAGOGASTRODUODENOSCOPY)  Patient Location: PACU  Anesthesia Type: General  Level of Consciousness: awake, alert  and patient cooperative  Airway and Oxygen Therapy: Patient Spontanous Breathing   Post-op Assessment: Post-op Vital signs reviewed, Patient's Cardiovascular Status Stable, Respiratory Function Stable, Patent Airway and No signs of Nausea or vomiting  Post-op Vital Signs: Reviewed and stable  Complications: No notable events documented.

## 2024-02-25 NOTE — Op Note (Signed)
 [facility] Gastroenterology Patient Name: Sandra Le Procedure Date: 02/25/2024 11:32 AM MRN: [id number] Account #: [id number] Date of Birth: 12/28/1960 Admit Type: Outpatient Age: 63 Room: [facility] OR ROOM 01 Gender: Female Note Status: Finalized Instrument Name: Endoscope [id number] Procedure:             Upper GI endoscopy Indications:           Epigastric abdominal pain, Esophageal dysphagia Providers:             Corinn Jess Brooklyn MD, MD Referring MD:          Fredy CANDIE Bathe, MD (Referring MD) Medicines:             General Anesthesia Complications:         No immediate complications. Estimated blood loss: None. Procedure:             Pre-Anesthesia Assessment:                        - Prior to the procedure, a History and Physical was                         performed, and patient medications and allergies were                         reviewed. The patient is competent. The risks and                         benefits of the procedure and the sedation options and                         risks were discussed with the patient. All questions                         were answered and informed consent was obtained.                         Patient identification and proposed procedure were                         verified by the physician, the nurse, the                         anesthesiologist, the anesthetist and the technician                         in the pre-procedure area in the procedure room in the                         endoscopy suite. Mental Status Examination: alert and                         oriented. Airway Examination: normal oropharyngeal                         airway and neck mobility. Respiratory Examination:                         clear to auscultation. CV Examination: normal.  Prophylactic Antibiotics: The patient does not require                         prophylactic antibiotics. Prior Anticoagulants: The                          patient has taken no anticoagulant or antiplatelet                         agents. ASA Grade Assessment: II - A patient with mild                         systemic disease. After reviewing the risks and                         benefits, the patient was deemed in satisfactory                         condition to undergo the procedure. The anesthesia                         plan was to use general anesthesia. Immediately prior                         to administration of medications, the patient was                         re-assessed for adequacy to receive sedatives. The                         heart rate, respiratory rate, oxygen saturations,                         blood pressure, adequacy of pulmonary ventilation, and                         response to care were monitored throughout the                         procedure. The physical status of the patient was                         re-assessed after the procedure.                        After obtaining informed consent, the endoscope was                         passed under direct vision. Throughout the procedure,                         the patient's blood pressure, pulse, and oxygen                         saturations were monitored continuously. The Endoscope                         was introduced through the mouth, and advanced to the  efferent jejunal loop. The upper GI endoscopy was                         accomplished without difficulty. The patient tolerated                         the procedure well. Findings:      Evidence of a Roux-en-Y gastrojejunostomy was found. The gastrojejunal       anastomosis was characterized by healthy appearing mucosa. This was       traversed. Two clips were visible at the anastamosis, these were removed       with biopsy forceps. The pouch-to-jejunum limb was characterized by       healthy appearing mucosa. The jejunojejunal anastomosis was       characterized by  healthy appearing mucosa. Biopsies were taken with a       cold forceps for histology. Estimated blood loss was minimal.      The gastroesophageal junction and examined esophagus were normal.       Biopsies were taken with a cold forceps for histology. Impression:            - Roux-en-Y gastrojejunostomy with gastrojejunal                         anastomosis characterized by healthy appearing mucosa.                         Biopsied.                        - Normal gastroesophageal junction and esophagus.                         Biopsied. Recommendation:        - Await pathology results.                        - Proceed with colonoscopy as scheduled                        See colonoscopy report Procedure Code(s):     --- Professional ---                        [phone], Esophagogastroduodenoscopy, flexible,                         transoral; with biopsy, single or multiple Diagnosis Code(s):     --- Professional ---                        Z98.0, Intestinal bypass and anastomosis status                        R10.13, Epigastric pain                        R13.14, Dysphagia, pharyngoesophageal phase CPT copyright 2022 American Medical Association. All rights reserved. The codes documented in this report are preliminary and upon coder review may  be revised to meet current compliance requirements. Dr. Corinn Brooklyn Corinn Jess Brooklyn MD, MD 02/25/2024 11:57:46 AM This report has been signed electronically. Number of Addenda: 0  Note Initiated On: 02/25/2024 11:32 AM Total Procedure Duration: 0 hours 8 minutes 1 second  Estimated Blood Loss:  Estimated blood loss was minimal.      [facility]

## 2024-02-25 NOTE — H&P (Signed)
 Sandra JONELLE Brooklyn, MD [facility] Gastroenterology, [address]  [place], [place] [address]  Main: [phone] Fax:  [phone] Pager: [phone]   Primary Care Physician:  Fernand Fredy RAMAN, MD Primary Gastroenterologist:  Dr. Corinn JONELLE Le  Pre-Procedure History & Physical: HPI:  Sandra Le is a 63 y.o. female is here for an endoscopy and colonoscopy.   Past Medical History:  Diagnosis Date   Anxiety    Basal cell carcinoma 2024   L shoulder, txted at [facility]   Depression    Depression    Diabetes mellitus without complication (HCC)    type 2   GERD (gastroesophageal reflux disease)    before bypass surgery   Heart murmur    managed by PCP no medications   Hyperlipidemia    Hypertension    Morbid obesity (HCC) 02/10/2018   Plantar fasciitis    Sleep apnea    does not use CPAP    Past Surgical History:  Procedure Laterality Date   BARIATRIC SURGERY     CESAREAN SECTION     COLONOSCOPY N/A     Prior to Admission medications   Medication Sig Start Date End Date Taking? Authorizing Provider  acetaminophen (TYLENOL) 500 MG tablet Take by mouth.   Yes [provider]  amLODipine  (NORVASC ) 5 MG tablet Take 1 tablet (5 mg total) by mouth daily. 08/08/23  Yes Fernand Fredy RAMAN, MD  b complex vitamins capsule Take 1 capsule by mouth daily. Patient taking differently: Take 1 capsule by mouth once a week.   Yes [provider]  Cholecalciferol (VITAMIN D3) 50 MCG (2000 UT) capsule Take 2,000 Units by mouth daily.   Yes [provider]  cloNIDine  (CATAPRES ) 0.3 MG tablet Take 0.15 mg by mouth daily.   Yes [provider]  DULoxetine (CYMBALTA) 60 MG capsule  03/07/18  Yes [provider]  glimepiride  (AMARYL ) 2 MG tablet Take 1 tablet (2 mg total) by mouth daily with breakfast. 04/26/23  Yes Fernand Fredy RAMAN, MD  Multiple Vitamin (MULTIVITAMIN) capsule Take by mouth.   Yes [provider]   pantoprazole  (PROTONIX ) 40 MG tablet Take 1 tablet (40 mg total) by mouth daily. 01/07/24 01/06/25 Yes Fernand Fredy RAMAN, MD  Probiotic Product (RA PROBIOTIC GUMMIES PO) Take 2 capsules by mouth daily.   Yes [provider]  QUEtiapine (SEROQUEL) 25 MG tablet Take 25 mg by mouth at bedtime. 01/06/24  Yes [provider]  sertraline (ZOLOFT) 100 MG tablet Take 100 mg by mouth daily.   Yes [provider]  simvastatin  (ZOCOR ) 20 MG tablet TAKE ONE TABLET BY MOUTH EVERY DAY Patient taking differently: Take 10 mg by mouth daily. 09/17/23  Yes Fernand Fredy RAMAN, MD  tacrolimus  (PROTOPIC ) 0.1 % ointment Apply topically as directed. qd to bid to aa rash lower abdomen and under breast prn flares 10/03/23  Yes Jackquline Sawyer, MD  Turmeric 400 MG CAPS Take by mouth.   Yes [provider]  hydrOXYzine (ATARAX) 50 MG tablet Take 50 mg by mouth every 8 (eight) hours as needed for anxiety.    [provider]    Allergies as of 02/18/2024 - Review Complete 02/17/2024  Allergen Reaction Noted   Macrobid  [nitrofurantoin ] Nausea Only 12/26/2023    Family History  Problem Relation Age of Onset   Arthritis Mother    Cancer Father        Melanoma   Diabetes Father    Cancer Brother  Throat   Arthritis Paternal Grandmother    Breast cancer Neg Hx    Bladder Cancer Neg Hx    Kidney cancer Neg Hx     Social History   Socioeconomic History   Marital status: Married    Spouse name: Not on file   Number of children: Not on file   Years of education: Not on file   Highest education level: Not on file  Occupational History   Not on file  Tobacco Use   Smoking status: Former    Current packs/day: 0.00    Types: Cigarettes    Quit date: 82    Years since quitting: 27.7   Smokeless tobacco: Never  Vaping Use   Vaping status: Never Used  Substance and Sexual Activity   Alcohol use: Yes    Comment: 1-2 glasses of wine or mixed drink   Drug use: Yes     Types: Marijuana, Cocaine, Crack cocaine, Other-see comments, Amphetamines    Comment: in late teens   Sexual activity: Not on file  Other Topics Concern   Not on file  Social History Narrative   Not on file   Social Drivers of Health   Financial Resource Strain: Low Risk  (02/18/2024)   Received from [facility]   Overall Financial Resource Strain (CARDIA)    Difficulty of Paying Living Expenses: Not hard at all  Food Insecurity: No Food Insecurity (02/18/2024)   Received from [facility]   Hunger Vital Sign    Within the past 12 months, you worried that your food would run out before you got the money to buy more.: Never true    Within the past 12 months, the food you bought just didn't last and you didn't have money to get more.: Never true  Transportation Needs: No Transportation Needs (02/18/2024)   Received from [facility] - Transportation    In the past 12 months, has lack of transportation kept you from medical appointments or from getting medications?: No    Lack of Transportation (Non-Medical): No  Physical Activity: Not on file  Stress: Not on file  Social Connections: Not on file  Intimate Partner Violence: Not on file    Review of Systems: See HPI, otherwise negative ROS  Physical Exam: BP 134/80   Pulse 63   Temp 98.4 F (36.9 C) (Tympanic)   Resp 16   Ht 5' 5 (1.651 m)   Wt 96.2 kg   SpO2 96%   BMI 35.28 kg/m  General:   Alert,  pleasant and cooperative in NAD Head:  Normocephalic and atraumatic. Neck:  Supple; no masses or thyromegaly. Lungs:  Clear throughout to auscultation.    Heart:  Regular rate and rhythm. Abdomen:  Soft, nontender and nondistended. Normal bowel sounds, without guarding, and without rebound.   Neurologic:  Alert and  oriented x4;  grossly normal neurologically.  Impression/Plan: Sandra Le is here for an endoscopy and colonoscopy to be performed for  epigastric pain/dysphagia, clon cancer screening  Risks, benefits, limitations, and alternatives regarding  endoscopy and colonoscopy have been reviewed with the patient.  Questions have been answered.  All parties agreeable.   Sandra Brooklyn, MD  02/25/2024, 10:50 AM

## 2024-02-26 ENCOUNTER — Encounter
Admission: RE | Admit: 2024-02-26 | Discharge: 2024-02-26 | Disposition: A | Discharge status: Home / Self Care | Source: Ambulatory Visit | Attending: Surgery | Admitting: Surgery

## 2024-02-26 ENCOUNTER — Other Ambulatory Visit: Payer: Self-pay

## 2024-02-26 VITALS — Ht 65.0 in | Wt 211.9 lb

## 2024-02-26 DIAGNOSIS — I10 Essential (primary) hypertension: Secondary | ICD-10-CM

## 2024-02-26 DIAGNOSIS — E119 Type 2 diabetes mellitus without complications: Secondary | ICD-10-CM

## 2024-02-26 DIAGNOSIS — Z01812 Encounter for preprocedural laboratory examination: Secondary | ICD-10-CM

## 2024-02-26 DIAGNOSIS — Z01810 Encounter for preprocedural cardiovascular examination: Secondary | ICD-10-CM

## 2024-02-26 DIAGNOSIS — I340 Nonrheumatic mitral (valve) insufficiency: Secondary | ICD-10-CM

## 2024-02-26 NOTE — Patient Instructions (Addendum)
 Your procedure is scheduled on:  [facility]  OCTOBER 15  Report to the Registration Desk on the 1st floor of the [facility] To find out your arrival time, please call [phone] between 1PM - 3PM on:  TUESDAY OCTOBER 14  If your arrival time is 6:00 am, do not arrive before that time as the [facility] entrance doors do not open until 6:00 am.  REMEMBER: Instructions that are not followed completely may result in serious medical risk, up to and including death; or upon the discretion of your surgeon and anesthesiologist your surgery may need to be rescheduled.  Do not eat food after midnight the night before surgery.  No gum chewing or hard candies.  You may however, drink WATER  up to 2 hours before you are scheduled to arrive for your surgery. Do not drink anything within 2 hours of your scheduled arrival time.   One week prior to surgery: Stop Anti-inflammatories (NSAIDS) such as Advil, Aleve, Ibuprofen, Motrin, Naproxen, Naprosyn and Aspirin based products such as Excedrin, Goody's Powder, BC Powder. Stop ANY OVER THE COUNTER supplements until after surgery. b complex  Cholecalciferol (VITAMIN D3)  Multiple Vitamin (MULTIVITAMIN)  Probiotic Product (RA PROBIOTIC GUMMIES PO)  Turmeric  You may however, continue to take Tylenol if needed for pain up until the day of surgery.  **Follow guidelines for insulin and diabetes medications.** glimepiride  (AMARYL ) hold the morning of surgery    Continue taking all of your other prescription medications up until the day of surgery.  ON THE DAY OF SURGERY ONLY TAKE THESE MEDICATIONS WITH SIPS OF WATER:  cloNIDine  (CATAPRES )   No Alcohol for 24 hours before or after surgery.   Do not use any recreational drugs for at least a week (preferably 2 weeks) before your surgery.  Please be advised that the combination of cocaine and anesthesia may have negative outcomes, up to and including death. If you test positive for cocaine, your  surgery will be cancelled.  On the morning of surgery brush your teeth with toothpaste and water, you may rinse your mouth with mouthwash if you wish. Do not swallow any toothpaste or mouthwash.  Use CHG Soap as directed on instruction sheet.  Do not wear jewelry, make-up, hairpins, clips or nail polish.  For welded (permanent) jewelry: bracelets, anklets, waist bands, etc.  Please have this removed prior to surgery.  If it is not removed, there is a chance that hospital personnel will need to cut it off on the day of surgery.  Do not wear lotions, powders, or perfumes.   Do not shave body hair from the neck down 48 hours before surgery.  Do not bring valuables to the hospital. [facility] is not responsible for any missing/lost belongings or valuables.   Notify your doctor if there is any change in your medical condition (cold, fever, infection).  Wear comfortable clothing (specific to your surgery type) to the hospital.  After surgery, you can help prevent lung complications by doing breathing exercises.  Take deep breaths and cough every 1-2 hours.   When coughing or sneezing, hold a pillow firmly against your incision with both hands. This is called "splinting." Doing this helps protect your incision. It also decreases belly discomfort.  If you are being discharged the day of surgery, you will not be allowed to drive home. You will need a responsible individual to drive you home and stay with you for 24 hours after surgery.   If you are taking public  transportation, you will need to have a responsible individual with you.  Please call the Pre-admissions Testing Dept. at [phone] if you have any questions about these instructions.  Surgery Visitation Policy:  Patients having surgery or a procedure may have two visitors.  Children under the age of 51 must have an adult with them who is not the patient.   Merchandiser, retail to address health-related social  needs:  [email or web address]                                                                                                             Preparing for Surgery with CHLORHEXIDINE GLUCONATE (CHG) Soap  Chlorhexidine Gluconate (CHG) Soap  o An antiseptic cleaner that kills germs and bonds with the skin to continue killing germs even after washing  o Used for showering the night before surgery and morning of surgery  Before surgery, you can play an important role by reducing the number of germs on your skin.  CHG (Chlorhexidine gluconate) soap is an antiseptic cleanser which kills germs and bonds with the skin to continue killing germs even after washing.  Please do not use if you have an allergy to CHG or antibacterial soaps. If your skin becomes reddened/irritated stop using the CHG.  1. Shower the NIGHT BEFORE SURGERY with CHG soap.  2. If you choose to wash your hair, wash your hair first as usual with your normal shampoo.  3. After shampooing, rinse your hair and body thoroughly to remove the shampoo.  4. Use CHG as you would any other liquid soap. You can apply CHG directly to the skin and wash gently with a clean washcloth.  5. Apply the CHG soap to your body only from the neck down. Do not use on open wounds or open sores. Avoid contact with your eyes, ears, mouth, and genitals (private parts). Wash face and genitals (private parts) with your normal soap.  6. Wash thoroughly, paying special attention to the area where your surgery will be performed.  7. Thoroughly rinse your body with warm water.  8. Do not shower/wash with your normal soap after using and rinsing off the CHG soap.  9. Do not use lotions, oils, etc., after showering with CHG.  10. Pat yourself dry with a clean towel.  11. Wear clean pajamas to bed the night before surgery.  12. Place clean sheets on your bed the night of your shower and do not sleep with pets.  13. Do not apply any  deodorants/lotions/powders.  14. Please wear clean clothes to the hospital.  15. Remember to brush your teeth with your regular toothpaste.

## 2024-02-27 ENCOUNTER — Encounter
Admission: RE | Admit: 2024-02-27 | Discharge: 2024-02-27 | Disposition: A | Discharge status: Home / Self Care | Source: Ambulatory Visit | Attending: Surgery | Admitting: Surgery

## 2024-02-27 DIAGNOSIS — I10 Essential (primary) hypertension: Secondary | ICD-10-CM | POA: Diagnosis not present

## 2024-02-27 DIAGNOSIS — Z01818 Encounter for other preprocedural examination: Secondary | ICD-10-CM | POA: Insufficient documentation

## 2024-02-27 DIAGNOSIS — I340 Nonrheumatic mitral (valve) insufficiency: Secondary | ICD-10-CM | POA: Insufficient documentation

## 2024-02-27 DIAGNOSIS — Z01812 Encounter for preprocedural laboratory examination: Secondary | ICD-10-CM

## 2024-02-27 DIAGNOSIS — Z01810 Encounter for preprocedural cardiovascular examination: Secondary | ICD-10-CM | POA: Diagnosis not present

## 2024-02-27 LAB — BASIC METABOLIC PANEL WITH GFR
Anion gap: 15 (ref 5–15)
BUN: 14 mg/dL (ref 8–23)
CO2: 22 mmol/L (ref 22–32)
Calcium: 9.4 mg/dL (ref 8.9–10.3)
Chloride: 103 mmol/L (ref 98–111)
Creatinine, Ser: 0.85 mg/dL (ref 0.44–1.00)
GFR, Estimated: >60 mL/min (ref >60)
Glucose, Bld: 159 mg/dL — ABNORMAL HIGH (ref 70–99)
Potassium: 4.2 mmol/L (ref 3.5–5.1)
Sodium: 140 mmol/L (ref 135–145)

## 2024-02-27 LAB — CBC
HCT: 38.8 % (ref 36.0–46.0)
Hemoglobin: 13.3 g/dL (ref 12.0–15.0)
MCH: 31 pg (ref 26.0–34.0)
MCHC: 34.3 g/dL (ref 30.0–36.0)
MCV: 90.4 fL (ref 80.0–100.0)
Platelets: 144 K/uL — ABNORMAL LOW (ref 150–400)
RBC: 4.29 MIL/uL (ref 3.87–5.11)
RDW: 12.1 % (ref 11.5–15.5)
WBC: 4.9 K/uL (ref 4.0–10.5)
nRBC: 0 % (ref 0.0–0.2)

## 2024-02-27 LAB — SURGICAL PATHOLOGY

## 2024-02-28 DIAGNOSIS — F4312 Post-traumatic stress disorder, chronic: Secondary | ICD-10-CM | POA: Diagnosis not present

## 2024-02-28 DIAGNOSIS — F5105 Insomnia due to other mental disorder: Secondary | ICD-10-CM | POA: Diagnosis not present

## 2024-02-28 DIAGNOSIS — F39 Unspecified mood [affective] disorder: Secondary | ICD-10-CM | POA: Diagnosis not present

## 2024-02-28 DIAGNOSIS — F411 Generalized anxiety disorder: Secondary | ICD-10-CM | POA: Diagnosis not present

## 2024-03-04 ENCOUNTER — Ambulatory Visit: Payer: Self-pay | Admitting: Urgent Care

## 2024-03-04 ENCOUNTER — Ambulatory Visit

## 2024-03-04 ENCOUNTER — Encounter
Admission: RE | Disposition: A | Discharge status: Home / Self Care | Payer: Self-pay | Source: Home / Self Care | Attending: Surgery

## 2024-03-04 ENCOUNTER — Ambulatory Visit: Payer: Self-pay | Admitting: Gastroenterology

## 2024-03-04 ENCOUNTER — Ambulatory Visit
Admission: RE | Admit: 2024-03-04 | Discharge: 2024-03-04 | Disposition: A | Discharge status: Home / Self Care | Attending: Surgery | Admitting: Surgery

## 2024-03-04 ENCOUNTER — Other Ambulatory Visit: Payer: Self-pay

## 2024-03-04 ENCOUNTER — Encounter: Payer: Self-pay | Admitting: Surgery

## 2024-03-04 DIAGNOSIS — Z9884 Bariatric surgery status: Secondary | ICD-10-CM | POA: Diagnosis not present

## 2024-03-04 DIAGNOSIS — K8010 Calculus of gallbladder with chronic cholecystitis without obstruction: Secondary | ICD-10-CM | POA: Insufficient documentation

## 2024-03-04 DIAGNOSIS — Z01812 Encounter for preprocedural laboratory examination: Secondary | ICD-10-CM

## 2024-03-04 DIAGNOSIS — E119 Type 2 diabetes mellitus without complications: Secondary | ICD-10-CM

## 2024-03-04 DIAGNOSIS — F418 Other specified anxiety disorders: Secondary | ICD-10-CM | POA: Diagnosis not present

## 2024-03-04 HISTORY — PX: INDOCYANINE GREEN FLUORESCENCE IMAGING (ICG): SHX7595

## 2024-03-04 LAB — GLUCOSE, CAPILLARY
Glucose-Capillary: 192 mg/dL — ABNORMAL HIGH (ref 70–99)
Glucose-Capillary: 205 mg/dL — ABNORMAL HIGH (ref 70–99)
Glucose-Capillary: 216 mg/dL — ABNORMAL HIGH (ref 70–99)

## 2024-03-04 SURGERY — CHOLECYSTECTOMY, ROBOT-ASSISTED, LAPAROSCOPIC
Anesthesia: General

## 2024-03-04 MED ORDER — GABAPENTIN 300 MG PO CAPS
300.0000 mg | ORAL_CAPSULE | ORAL | Status: AC
  Administered 2024-03-04: 300 mg via ORAL

## 2024-03-04 MED ORDER — ROCURONIUM BROMIDE 10 MG/ML (PF) SYRINGE
PREFILLED_SYRINGE | INTRAVENOUS | Status: AC
  Filled 2024-03-04: qty 10

## 2024-03-04 MED ORDER — MIDAZOLAM HCL 2 MG/2ML IJ SOLN
INTRAMUSCULAR | Status: DC | PRN
  Administered 2024-03-04: 2 mg via INTRAVENOUS

## 2024-03-04 MED ORDER — PHENYLEPHRINE 80 MCG/ML (10ML) SYRINGE FOR IV PUSH (FOR BLOOD PRESSURE SUPPORT)
PREFILLED_SYRINGE | INTRAVENOUS | Status: DC | PRN
Start: 2024-03-04 — End: 2024-03-04
  Administered 2024-03-04: 80 ug via INTRAVENOUS

## 2024-03-04 MED ORDER — CHLORHEXIDINE GLUCONATE CLOTH 2 % EX PADS
6.0000 | MEDICATED_PAD | Freq: Once | CUTANEOUS | Status: AC
  Administered 2024-03-04: 6 via TOPICAL

## 2024-03-04 MED ORDER — PROPOFOL 10 MG/ML IV BOLUS
INTRAVENOUS | Status: DC | PRN
  Administered 2024-03-04: 150 mg via INTRAVENOUS

## 2024-03-04 MED ORDER — SODIUM CHLORIDE 0.9 % IV SOLN: INTRAVENOUS | Status: DC

## 2024-03-04 MED ORDER — PROPOFOL 10 MG/ML IV BOLUS
INTRAVENOUS | Status: AC
  Filled 2024-03-04: qty 20

## 2024-03-04 MED ORDER — CELECOXIB 200 MG PO CAPS
200.0000 mg | ORAL_CAPSULE | ORAL | Status: AC
  Administered 2024-03-04: 200 mg via ORAL

## 2024-03-04 MED ORDER — ROCURONIUM BROMIDE 100 MG/10ML IV SOLN
INTRAVENOUS | Status: DC | PRN
Start: 2024-03-04 — End: 2024-03-04
  Administered 2024-03-04: 50 mg via INTRAVENOUS

## 2024-03-04 MED ORDER — BUPIVACAINE-EPINEPHRINE (PF) 0.25% -1:200000 IJ SOLN
INTRAMUSCULAR | Status: DC | PRN
  Administered 2024-03-04: 30 mL

## 2024-03-04 MED ORDER — INDOCYANINE GREEN 25 MG IV SOLR
1.2500 mg | Freq: Once | INTRAVENOUS | Status: AC
  Administered 2024-03-04: 1.25 mg via INTRAVENOUS

## 2024-03-04 MED ORDER — ACETAMINOPHEN 500 MG PO TABS
ORAL_TABLET | ORAL | Status: AC
Start: 2024-03-04 — End: 2024-03-04
  Filled 2024-03-04: qty 2

## 2024-03-04 MED ORDER — GLYCOPYRROLATE 0.2 MG/ML IJ SOLN
INTRAMUSCULAR | Status: AC
  Filled 2024-03-04: qty 1

## 2024-03-04 MED ORDER — OXYCODONE HCL 5 MG PO TABS
ORAL_TABLET | ORAL | Status: AC
  Filled 2024-03-04: qty 1

## 2024-03-04 MED ORDER — GABAPENTIN 300 MG PO CAPS
ORAL_CAPSULE | ORAL | Status: AC
  Filled 2024-03-04: qty 1

## 2024-03-04 MED ORDER — CELECOXIB 200 MG PO CAPS
ORAL_CAPSULE | ORAL | Status: AC
  Filled 2024-03-04: qty 1

## 2024-03-04 MED ORDER — FENTANYL CITRATE (PF) 100 MCG/2ML IJ SOLN
25.0000 ug | INTRAMUSCULAR | Status: DC | PRN
  Administered 2024-03-04: 25 ug via INTRAVENOUS

## 2024-03-04 MED ORDER — ORAL CARE MOUTH RINSE: 15.0000 mL | Freq: Once | OROMUCOSAL | Status: AC

## 2024-03-04 MED ORDER — LIDOCAINE HCL (PF) 2 % IJ SOLN
INTRAMUSCULAR | Status: AC
Start: 2024-03-04 — End: 2024-03-04
  Filled 2024-03-04: qty 5

## 2024-03-04 MED ORDER — OXYCODONE HCL 5 MG/5ML PO SOLN: 5.0000 mg | Freq: Once | ORAL | Status: AC | PRN

## 2024-03-04 MED ORDER — CEFAZOLIN SODIUM-DEXTROSE 2-4 GM/100ML-% IV SOLN
INTRAVENOUS | Status: AC
Start: 2024-03-04 — End: 2024-03-04
  Filled 2024-03-04: qty 100

## 2024-03-04 MED ORDER — OXYCODONE HCL 5 MG PO TABS: 5.0000 mg | ORAL_TABLET | Freq: Four times a day (QID) | ORAL | 0 refills | Status: AC | PRN

## 2024-03-04 MED ORDER — BUPIVACAINE-EPINEPHRINE (PF) 0.25% -1:200000 IJ SOLN
INTRAMUSCULAR | Status: AC
  Filled 2024-03-04: qty 30

## 2024-03-04 MED ORDER — OXYCODONE HCL 5 MG PO TABS
5.0000 mg | ORAL_TABLET | Freq: Once | ORAL | Status: AC | PRN
  Administered 2024-03-04: 5 mg via ORAL

## 2024-03-04 MED ORDER — PROPOFOL 1000 MG/100ML IV EMUL
INTRAVENOUS | Status: AC
  Filled 2024-03-04: qty 100

## 2024-03-04 MED ORDER — FENTANYL CITRATE (PF) 100 MCG/2ML IJ SOLN
INTRAMUSCULAR | Status: AC
  Filled 2024-03-04: qty 2

## 2024-03-04 MED ORDER — INSULIN ASPART 100 UNIT/ML IJ SOLN
5.0000 [IU] | Freq: Once | INTRAMUSCULAR | Status: AC
  Administered 2024-03-04: 5 [IU] via SUBCUTANEOUS

## 2024-03-04 MED ORDER — IBUPROFEN 800 MG PO TABS: 800.0000 mg | ORAL_TABLET | Freq: Three times a day (TID) | ORAL | 0 refills | Status: AC | PRN

## 2024-03-04 MED ORDER — LIDOCAINE HCL (CARDIAC) PF 100 MG/5ML IV SOSY
PREFILLED_SYRINGE | INTRAVENOUS | Status: DC | PRN
  Administered 2024-03-04: 100 mg via INTRAVENOUS

## 2024-03-04 MED ORDER — GLYCOPYRROLATE 0.2 MG/ML IJ SOLN
INTRAMUSCULAR | Status: DC | PRN
  Administered 2024-03-04: .2 mg via INTRAVENOUS

## 2024-03-04 MED ORDER — EPHEDRINE SULFATE-NACL 50-0.9 MG/10ML-% IV SOSY
PREFILLED_SYRINGE | INTRAVENOUS | Status: DC | PRN
  Administered 2024-03-04: 5 mg via INTRAVENOUS
  Administered 2024-03-04 (×2): 10 mg via INTRAVENOUS

## 2024-03-04 MED ORDER — SUGAMMADEX SODIUM 200 MG/2ML IV SOLN
INTRAVENOUS | Status: DC | PRN
  Administered 2024-03-04: 200 mg via INTRAVENOUS

## 2024-03-04 MED ORDER — ACETAMINOPHEN 500 MG PO TABS
1000.0000 mg | ORAL_TABLET | ORAL | Status: AC
  Administered 2024-03-04: 1000 mg via ORAL

## 2024-03-04 MED ORDER — BUPIVACAINE LIPOSOME 1.3 % IJ SUSP: 20.0000 mL | Freq: Once | INTRAMUSCULAR | Status: DC

## 2024-03-04 MED ORDER — KETAMINE HCL 10 MG/ML IJ SOLN
INTRAMUSCULAR | Status: DC | PRN
  Administered 2024-03-04: 30 mg via INTRAVENOUS

## 2024-03-04 MED ORDER — DEXAMETHASONE SOD PHOSPHATE PF 10 MG/ML IJ SOLN
INTRAMUSCULAR | Status: DC | PRN
  Administered 2024-03-04: 5 mg via INTRAVENOUS

## 2024-03-04 MED ORDER — CEFAZOLIN SODIUM-DEXTROSE 2-4 GM/100ML-% IV SOLN: 2.0000 g | INTRAVENOUS | Status: DC

## 2024-03-04 MED ORDER — ONDANSETRON HCL 4 MG/2ML IJ SOLN
INTRAMUSCULAR | Status: AC
  Filled 2024-03-04: qty 2

## 2024-03-04 MED ORDER — PROPOFOL 500 MG/50ML IV EMUL
INTRAVENOUS | Status: DC | PRN
  Administered 2024-03-04: 125 ug/kg/min via INTRAVENOUS

## 2024-03-04 MED ORDER — MIDAZOLAM HCL 2 MG/2ML IJ SOLN
INTRAMUSCULAR | Status: AC
  Filled 2024-03-04: qty 2

## 2024-03-04 MED ORDER — ONDANSETRON HCL 4 MG/2ML IJ SOLN
INTRAMUSCULAR | Status: DC | PRN
  Administered 2024-03-04: 4 mg via INTRAVENOUS

## 2024-03-04 MED ORDER — INSULIN ASPART 100 UNIT/ML IJ SOLN
INTRAMUSCULAR | Status: AC
  Filled 2024-03-04: qty 1

## 2024-03-04 MED ORDER — CHLORHEXIDINE GLUCONATE 0.12 % MT SOLN
15.0000 mL | Freq: Once | OROMUCOSAL | Status: AC
  Administered 2024-03-04: 15 mL via OROMUCOSAL

## 2024-03-04 MED ORDER — LACTATED RINGERS IV SOLN: INTRAVENOUS | Status: DC | PRN

## 2024-03-04 MED ORDER — CHLORHEXIDINE GLUCONATE 0.12 % MT SOLN
OROMUCOSAL | Status: AC
  Filled 2024-03-04: qty 15

## 2024-03-04 MED ORDER — KETAMINE HCL 50 MG/5ML IJ SOSY
PREFILLED_SYRINGE | INTRAMUSCULAR | Status: AC
  Filled 2024-03-04: qty 5

## 2024-03-04 SURGICAL SUPPLY — 38 items
BAG PRESSURE INF REUSE 3000 (BAG) IMPLANT
CLIP LIGATING HEM O LOK PURPLE (MISCELLANEOUS) ×3 IMPLANT
COVER TIP SHEARS 8 DVNC (MISCELLANEOUS) ×2 IMPLANT
DEFOGGER SCOPE WARM SEASHARP (MISCELLANEOUS) ×2 IMPLANT
DERMABOND ADVANCED .7 DNX12 (GAUZE/BANDAGES/DRESSINGS) ×2 IMPLANT
DRAPE ARM DVNC X/XI (DISPOSABLE) ×8 IMPLANT
DRAPE COLUMN DVNC XI (DISPOSABLE) ×3 IMPLANT
ELECTRODE REM PT RTRN 9FT ADLT (ELECTROSURGICAL) ×2 IMPLANT
FORCEPS BPLR R/ABLATION 8 DVNC (INSTRUMENTS) ×2 IMPLANT
FORCEPS PROGRASP DVNC XI (FORCEP) ×2 IMPLANT
GLOVE ORTHO TXT STRL SZ7.5 (GLOVE) ×4 IMPLANT
GOWN STRL REUS W/ TWL LRG LVL3 (GOWN DISPOSABLE) ×4 IMPLANT
GOWN STRL REUS W/ TWL XL LVL3 (GOWN DISPOSABLE) ×6 IMPLANT
GRASPER SUT TROCAR 14GX15 (MISCELLANEOUS) ×3 IMPLANT
IRRIGATION STRYKERFLOW (MISCELLANEOUS) IMPLANT
IRRIGATOR SUCT 8 DISP DVNC XI (IRRIGATION / IRRIGATOR) IMPLANT
KIT PINK PAD W/HEAD ARM REST (MISCELLANEOUS) ×3 IMPLANT
LABEL OR SOLS (LABEL) ×2 IMPLANT
MANIFOLD NEPTUNE II (INSTRUMENTS) ×2 IMPLANT
NDL HYPO 22X1.5 SAFETY MO (MISCELLANEOUS) ×3 IMPLANT
NDL INSUFFLATION 14GA 120MM (NEEDLE) ×2 IMPLANT
NEEDLE HYPO 22X1.5 SAFETY MO (MISCELLANEOUS) ×2 IMPLANT
NEEDLE INSUFFLATION 14GA 120MM (NEEDLE) ×2 IMPLANT
NS IRRIG 500ML POUR BTL (IV SOLUTION) ×3 IMPLANT
PACK LAP CHOLECYSTECTOMY (MISCELLANEOUS) ×2 IMPLANT
SCISSORS MNPLR CVD DVNC XI (INSTRUMENTS) ×3 IMPLANT
SEAL UNIV 5-12 XI (MISCELLANEOUS) ×8 IMPLANT
SET TUBE SMOKE EVAC HIGH FLOW (TUBING) ×2 IMPLANT
SOL .9 NS 3000ML IRR UROMATIC (IV SOLUTION) IMPLANT
SOLUTION ELECTROSURG ANTI STCK (MISCELLANEOUS) ×3 IMPLANT
SPIKE FLUID TRANSFER (MISCELLANEOUS) ×3 IMPLANT
SUT VICRYL 0 UR6 27IN ABS (SUTURE) ×2 IMPLANT
SUTURE MNCRL 4-0 27XMF (SUTURE) ×2 IMPLANT
SYRINGE TOOMEY IRRIG 70ML (MISCELLANEOUS) IMPLANT
SYSTEM BAG RETRIEVAL 10MM (BASKET) ×3 IMPLANT
TRAP FLUID SMOKE EVACUATOR (MISCELLANEOUS) ×3 IMPLANT
TROCAR Z-THREAD FIOS 11X100 BL (TROCAR) ×2 IMPLANT
WATER STERILE IRR 500ML POUR (IV SOLUTION) ×3 IMPLANT

## 2024-03-04 NOTE — Op Note (Signed)
 Robotic cholecystectomy with Indocyamine Green Ductal Imaging.   Pre-operative Diagnosis: Chronic calculus cholecystitis  Post-operative Diagnosis:  Same.  Procedure: Robotic assisted laparoscopic cholecystectomy with Indocyamine Green Ductal Imaging.   Surgeon: [name], M.D., FACS  Anesthesia: General. with endotracheal tube  Findings: as expected  Estimated Blood Loss: 10 mL         Drains: None         Specimens: Gallbladder           Complications: none  Procedure Details  The patient was seen again in the Holding Room.  1.25 mg dose of ICG was administered intravenously.   The benefits, complications, treatment options, risks and expected outcomes were again reviewed with the patient. The likelihood of improving the patient's symptoms with return to their baseline status is good.  The patient and/or family concurred with the proposed plan, giving informed consent, again alternatives reviewed.  The patient was taken to Operating Room, identified, and the procedure verified as robotic assisted laparoscopic cholecystectomy.  Prior to the induction of general anesthesia, antibiotic prophylaxis was administered. VTE prophylaxis was in place. General endotracheal anesthesia was then administered and tolerated well. The patient was positioned in the supine position.  After the induction, the abdomen was prepped with Chloraprep and draped in the sterile fashion.  A Time Out was held and the above information confirmed.  After local infiltration of quarter percent Marcaine with epinephrine, stab incision was made left upper quadrant.  Just below the costal margin at Palmer's point, approximately midclavicular line the Veres needle is passed with sensation of the layers to penetrate the abdominal wall and into the peritoneum.  Saline drop test is confirmed peritoneal placement.  Insufflation is initiated with carbon dioxide to pressures of 15 mmHg.  Local infiltration with 0.25%  Marcaine with epinephrine is utilized for all skin incisions.  Made a 12 mm incision on the right periumbilical site, I advanced an optical 11mm port under direct visualization into the peritoneal cavity.  Once the peritoneum was penetrated, insufflation was initiated.  The trocar was then advanced into the abdominal cavity under direct visualization. Pneumoperitoneum was then continued utilizing CO2 at 15 mmHg or less and tolerated well without any adverse changes in the patient's vital signs.  Two 8.5-mm ports were placed in the left lower quadrant and laterally, and one to the right lower quadrant, all under direct vision. Local infiltration with a mixture of Exparel and 0.25% Marcaine with epinephrine is utilized for all port sites with deep infiltration under visualization.   The patient was positioned  in reverse Trendelenburg, tilted the patient's left side down.  Da Vinci XI robot was then positioned on to the patient's left side, and docked.  The gallbladder was identified, the fundus grasped via the arm 4 Prograsp and retracted cephalad. Adhesions were lysed with scissors and cautery.  The infundibulum was identified grasped and retracted laterally, exposing the peritoneum overlying the triangle of Calot. This was then opened and dissected using cautery & scissors.  We did achieve the critical view of safety with 2 and only 2 structures entering the gallbladder and we were able to see the liver plate posterior to this from both sides;with the extended critical view of the cystic duct and cystic artery obtained, aided by the ICG via FireFly which improved localization of the major ductal anatomy.    The cystic duct was clearly identified and dissected to isolation.   Artery well isolated and clipped, and the cystic duct was triple  clipped and divided with scissors, as close to the gallbladder neck as feasible, thus leaving two on the remaining stump.  The specimen side of the artery is sealed with  bipolar and divided with monopolar scissors.   The gallbladder was taken from the gallbladder fossa in a retrograde fashion with the electrocautery. The gallbladder was removed and placed in an Endocatch bag.  The liver bed is inspected. Hemostasis was confirmed.  The robot was undocked and moved away from the operative field. No irrigation was utilized.  The gallbladder and Endocatch sac were then removed through the paraumbilical port site.   Inspection of the right upper quadrant was performed. No bleeding, bile duct injury or leak, or bowel injury was noted. The infra-umbilical port site fascia was closed with interrumpted 0 Vicryl sutures using PMI/cone under direct visualization. Pneumoperitoneum was released and ports removed.  4-0 subcuticular Monocryl was used to close the skin. Dermabond was  applied.  The patient was then extubated and brought to the recovery room in stable condition. Sponge, lap, and needle counts were correct at closure and at the conclusion of the case.               [name], M.D., [facility] 03/04/2024 8:45 AM

## 2024-03-04 NOTE — Interval H&P Note (Signed)
 History and Physical Interval Note:  03/04/2024 7:29 AM  Sandra Le  has presented today for surgery, with the diagnosis of Chronic calculous cholecystitis.  The various methods of treatment have been discussed with the patient and family. After consideration of risks, benefits and other options for treatment, the patient has consented to  Procedure(s) with comments: CHOLECYSTECTOMY, ROBOT-ASSISTED, LAPAROSCOPIC (N/A) - w/ICG as a surgical intervention.  The patient's history has been reviewed, patient examined, no change in status, stable for surgery.  I have reviewed the patient's chart and labs.  Questions were answered to the patient's satisfaction.     [name]

## 2024-03-04 NOTE — Transfer of Care (Signed)
 Immediate Anesthesia Transfer of Care Note  Patient: Sandra Le  Procedure(s) Performed: CHOLECYSTECTOMY, ROBOT-ASSISTED, LAPAROSCOPIC INDOCYANINE GREEN FLUORESCENCE IMAGING (ICG)  Patient Location: PACU  Anesthesia Type:General  Level of Consciousness: awake  Airway & Oxygen Therapy: Patient Spontanous Breathing and Patient connected to nasal cannula oxygen  Post-op Assessment: Report given to RN and Post -op Vital signs reviewed and stable  Post vital signs: Reviewed and stable  Last Vitals:  Vitals Value Taken Time  BP 107/66 03/04/24 08:43  Temp 36.6 C 03/04/24 08:45  Pulse 63 03/04/24 08:49  Resp 15 03/04/24 08:49  SpO2 96 % 03/04/24 08:49  Vitals shown include unfiled device data.  Last Pain:  Vitals:   03/04/24 0620  TempSrc: Tympanic  PainSc: 0-No pain         Complications: No notable events documented.

## 2024-03-04 NOTE — Anesthesia Postprocedure Evaluation (Signed)
 Anesthesia Post Note  Patient: LOLETHA BERTINI  Procedure(s) Performed: CHOLECYSTECTOMY, ROBOT-ASSISTED, LAPAROSCOPIC INDOCYANINE GREEN FLUORESCENCE IMAGING (ICG)  Patient location during evaluation: PACU Anesthesia Type: General Level of consciousness: awake and alert Pain management: pain level controlled Vital Signs Assessment: post-procedure vital signs reviewed and stable Respiratory status: spontaneous breathing, nonlabored ventilation, respiratory function stable and patient connected to nasal cannula oxygen Cardiovascular status: blood pressure returned to baseline and stable Postop Assessment: no apparent nausea or vomiting Anesthetic complications: no   No notable events documented.   Last Vitals:  Vitals:   03/04/24 0941 03/04/24 1001  BP:  (!) 101/53  Pulse: (!) 56 (!) 52  Resp: 12 14  Temp:  36.5 C  SpO2: 94% 94%    Last Pain:  Vitals:   03/04/24 1001  TempSrc: Temporal  PainSc: 2                  Debby Mines

## 2024-03-04 NOTE — Anesthesia Procedure Notes (Signed)
 Procedure Name: Intubation Date/Time: 03/04/2024 7:41 AM  Performed by: Brien Sotero PARAS, CRNAPre-anesthesia Checklist: Patient identified, Patient being monitored, Timeout performed, Emergency Drugs available and Suction available Patient Re-evaluated:Patient Re-evaluated prior to induction Oxygen Delivery Method: Circle system utilized Preoxygenation: Pre-oxygenation with 100% oxygen Induction Type: IV induction Ventilation: Mask ventilation without difficulty Laryngoscope Size: 3 and McGrath Grade View: Grade I Tube type: Oral Tube size: 7.0 mm Number of attempts: 1 Airway Equipment and Method: Stylet Placement Confirmation: ETT inserted through vocal cords under direct vision, positive ETCO2 and breath sounds checked- equal and bilateral Secured at: 20 cm Tube secured with: Tape Dental Injury: Teeth and Oropharynx as per pre-operative assessment

## 2024-03-04 NOTE — Anesthesia Preprocedure Evaluation (Signed)
 Anesthesia Evaluation  Patient identified by MRN, date of birth, ID band Patient awake    Reviewed: Allergy & Precautions, H&P , NPO status , Patient's Chart, lab work & pertinent test results  Airway Mallampati: III  TM Distance: >3 FB Neck ROM: full    Dental  (+) Missing   Pulmonary sleep apnea and Continuous Positive Airway Pressure Ventilation , former smoker   Pulmonary exam normal        Cardiovascular hypertension, + DVT  Normal cardiovascular exam     Neuro/Psych  PSYCHIATRIC DISORDERS Anxiety Depression    negative neurological ROS     GI/Hepatic Neg liver ROS,GERD  ,,  Endo/Other  diabetes, Type 2    Renal/GU      Musculoskeletal   Abdominal   Peds  Hematology negative hematology ROS (+)   Anesthesia Other Findings Past Medical History: No date: Anxiety 2024: Basal cell carcinoma     Comment:  L shoulder, txted at [facility] No date: Depression No date: Depression No date: Diabetes mellitus without complication (HCC)     Comment:  type 2 No date: GERD (gastroesophageal reflux disease)     Comment:  before bypass surgery No date: Heart murmur     Comment:  managed by PCP no medications No date: Hyperlipidemia No date: Hypertension 02/10/2018: Morbid obesity (HCC) No date: Plantar fasciitis No date: Sleep apnea     Comment:  does not use CPAP  Past Surgical History: No date: BARIATRIC SURGERY No date: CESAREAN SECTION No date: COLONOSCOPY; N/A  BMI    Body Mass Index: 35.28 kg/m      Reproductive/Obstetrics negative OB ROS                              Anesthesia Physical Anesthesia Plan  ASA: 2  Anesthesia Plan: General ETT   Post-op Pain Management:    Induction: Intravenous  PONV Risk Score and Plan:   Airway Management Planned: Oral ETT  Additional Equipment:   Intra-op Plan:   Post-operative Plan: Extubation in OR  Informed Consent: I  have reviewed the patients History and Physical, chart, labs and discussed the procedure including the risks, benefits and alternatives for the proposed anesthesia with the patient or authorized representative who has indicated his/her understanding and acceptance.     Dental Advisory Given  Plan Discussed with: Anesthesiologist, CRNA and Surgeon  Anesthesia Plan Comments: (Patient consented for risks of anesthesia including but not limited to:  - adverse reactions to medications - damage to eyes, teeth, lips or other oral mucosa - nerve damage due to positioning  - sore throat or hoarseness - Damage to heart, brain, nerves, lungs, other parts of body or loss of life  Patient voiced understanding and assent.)        Anesthesia Quick Evaluation

## 2024-03-05 ENCOUNTER — Encounter: Payer: Self-pay | Admitting: Surgery

## 2024-03-05 ENCOUNTER — Ambulatory Visit: Payer: Self-pay | Admitting: Internal Medicine

## 2024-03-05 LAB — SURGICAL PATHOLOGY

## 2024-03-06 ENCOUNTER — Other Ambulatory Visit: Payer: Self-pay | Admitting: Internal Medicine

## 2024-03-10 DIAGNOSIS — F331 Major depressive disorder, recurrent, moderate: Secondary | ICD-10-CM | POA: Diagnosis not present

## 2024-03-18 ENCOUNTER — Encounter: Admitting: Physician Assistant

## 2024-03-25 ENCOUNTER — Encounter: Payer: Self-pay | Admitting: Physician Assistant

## 2024-03-25 ENCOUNTER — Ambulatory Visit (INDEPENDENT_AMBULATORY_CARE_PROVIDER_SITE_OTHER): Admitting: Physician Assistant

## 2024-03-25 VITALS — BP 135/85 | HR 99 | Temp 97.7°F | Ht 65.0 in | Wt 212.0 lb

## 2024-03-25 DIAGNOSIS — Z09 Encounter for follow-up examination after completed treatment for conditions other than malignant neoplasm: Secondary | ICD-10-CM

## 2024-03-25 DIAGNOSIS — F331 Major depressive disorder, recurrent, moderate: Secondary | ICD-10-CM | POA: Diagnosis not present

## 2024-03-25 DIAGNOSIS — K8010 Calculus of gallbladder with chronic cholecystitis without obstruction: Secondary | ICD-10-CM

## 2024-03-25 MED ORDER — ONDANSETRON 4 MG PO TBDP: 4.0000 mg | ORAL_TABLET | Freq: Three times a day (TID) | ORAL | 0 refills | Status: AC | PRN

## 2024-03-25 NOTE — Patient Instructions (Signed)

## 2024-03-25 NOTE — Progress Notes (Signed)
 [facility] POST-OP OFFICE VISIT  03/25/2024  HPI: Sandra Le is a 63 y.o. female 21 days s/p robotic assisted laparoscopic cholecystectomy for [facility] with Dr Lane   She continues to feel crampy abdominal pain and excess gas; tried Gas-X without significant relief - reports this is similar to how she was feeling before surgery  Some nausea No fever, chills, emesis; No diarrhea She is tolerating PO; trying to limit some fried/fatty foods Incisions are well healed  No other complaints   Vital signs: BP 135/85   Pulse 99   Temp 97.7 F (36.5 C) (Oral)   Ht 5' 5 (1.651 m)   Wt 212 lb (96.2 kg)   SpO2 98%   BMI 35.28 kg/m    Physical Exam: Constitutional: Well appearing female, NAD Abdomen: Soft, non-tender, non-distended, no rebound/guarding Skin: Laparoscopic incisions are healing well, no erythema or drainage   Assessment/Plan: This is a 63 y.o. female 21 days s/p robotic assisted laparoscopic cholecystectomy for [facility] with Dr Lane    - Unsure etiology of her nausea and crampy gas pains. She may have some indigestion secondary to cholecystectomy although she is without any signs of major complication nor diarrhea. I did encourage her to continue dietary recommendations and I will send Zofran ODT to see if this can improve PO tolerance/intake. She does report this is similar to her symptoms prior to surgery and that sometimes cholecystectomy does not resolve symptoms if they are unrelated to the gallbladder.   - Pain control prn  - Reviewed wound care recommendation  - Reviewed lifting restrictions; 4 weeks total  - Reviewed surgical pathology; [facility]  - She will monitor symptoms and call/RTC with concerns ro failure to improve   -- Arthea Platt, PA-C [facility] 03/25/2024, 3:14 PM M-F: 7am - 4pm

## 2024-03-31 ENCOUNTER — Encounter: Payer: Self-pay | Admitting: Internal Medicine

## 2024-03-31 ENCOUNTER — Ambulatory Visit: Payer: Self-pay | Admitting: Internal Medicine

## 2024-03-31 ENCOUNTER — Ambulatory Visit (INDEPENDENT_AMBULATORY_CARE_PROVIDER_SITE_OTHER): Admitting: Internal Medicine

## 2024-03-31 DIAGNOSIS — N3001 Acute cystitis with hematuria: Secondary | ICD-10-CM

## 2024-03-31 DIAGNOSIS — R300 Dysuria: Secondary | ICD-10-CM

## 2024-03-31 LAB — POCT URINALYSIS DIPSTICK
Bilirubin, UA: NEGATIVE
Glucose, UA: NEGATIVE
Ketones, UA: NEGATIVE
Nitrite, UA: NEGATIVE
Protein, UA: POSITIVE — AB
Spec Grav, UA: 1.02 (ref 1.010–1.025)
Urobilinogen, UA: 1 U/dL
pH, UA: 6.5 (ref 5.0–8.0)

## 2024-03-31 MED ORDER — CIPROFLOXACIN HCL 500 MG PO TABS: 500.0000 mg | ORAL_TABLET | Freq: Two times a day (BID) | ORAL | 0 refills | Status: AC

## 2024-03-31 NOTE — Progress Notes (Signed)
 Urine sent for c/s. Empirically start po cipro .

## 2024-03-31 NOTE — Progress Notes (Signed)
 Patient notified

## 2024-04-03 LAB — URINE CULTURE

## 2024-04-03 NOTE — Progress Notes (Signed)
 Patient notified

## 2024-04-07 ENCOUNTER — Other Ambulatory Visit

## 2024-04-07 DIAGNOSIS — R1010 Upper abdominal pain, unspecified: Secondary | ICD-10-CM | POA: Diagnosis not present

## 2024-04-08 DIAGNOSIS — F331 Major depressive disorder, recurrent, moderate: Secondary | ICD-10-CM | POA: Diagnosis not present

## 2024-04-08 LAB — LIPASE: Lipase: 69 U/L (ref 14–72)

## 2024-04-09 ENCOUNTER — Encounter: Payer: Self-pay | Admitting: Internal Medicine

## 2024-04-09 ENCOUNTER — Ambulatory Visit: Admitting: Internal Medicine

## 2024-04-09 ENCOUNTER — Ambulatory Visit: Payer: Self-pay | Admitting: Internal Medicine

## 2024-04-09 VITALS — BP 114/78 | HR 91 | Ht 65.0 in | Wt 216.6 lb

## 2024-04-09 DIAGNOSIS — I152 Hypertension secondary to endocrine disorders: Secondary | ICD-10-CM | POA: Diagnosis not present

## 2024-04-09 DIAGNOSIS — E66812 Obesity, class 2: Secondary | ICD-10-CM

## 2024-04-09 DIAGNOSIS — E1165 Type 2 diabetes mellitus with hyperglycemia: Secondary | ICD-10-CM | POA: Diagnosis not present

## 2024-04-09 DIAGNOSIS — E1169 Type 2 diabetes mellitus with other specified complication: Secondary | ICD-10-CM | POA: Diagnosis not present

## 2024-04-09 DIAGNOSIS — R1010 Upper abdominal pain, unspecified: Secondary | ICD-10-CM

## 2024-04-09 DIAGNOSIS — E782 Mixed hyperlipidemia: Secondary | ICD-10-CM | POA: Diagnosis not present

## 2024-04-09 DIAGNOSIS — Z6836 Body mass index (BMI) 36.0-36.9, adult: Secondary | ICD-10-CM

## 2024-04-09 DIAGNOSIS — E1159 Type 2 diabetes mellitus with other circulatory complications: Secondary | ICD-10-CM

## 2024-04-09 DIAGNOSIS — E559 Vitamin D deficiency, unspecified: Secondary | ICD-10-CM

## 2024-04-09 DIAGNOSIS — R300 Dysuria: Secondary | ICD-10-CM

## 2024-04-09 LAB — POCT URINALYSIS DIPSTICK
Bilirubin, UA: NEGATIVE
Glucose, UA: NEGATIVE
Ketones, UA: NEGATIVE
Leukocytes, UA: NEGATIVE
Nitrite, UA: NEGATIVE
Protein, UA: NEGATIVE
Spec Grav, UA: >=1.03 — AB (ref 1.010–1.025)
Urobilinogen, UA: 1 U/dL
pH, UA: 5.5 (ref 5.0–8.0)

## 2024-04-09 LAB — POC CREATINE & ALBUMIN,URINE
Creatinine, POC: 200 mg/dL
Microalbumin Ur, POC: 80 mg/L

## 2024-04-09 LAB — POCT CBG (FASTING - GLUCOSE)-MANUAL ENTRY: Glucose Fasting, POC: 265 mg/dL — AB (ref 70–99)

## 2024-04-09 NOTE — Progress Notes (Signed)
 Established Patient Office Visit  Subjective:  Patient ID: Sandra Le, female    DOB: 1960-08-30  Age: 63 y.o. MRN: [id number]  Chief Complaint  Patient presents with   Follow-up    3 month follow up    Patient is here today for follow up. She reports doing fairly well since her last appointment.  She has had her gallbladder removed since her previous appointment. She also had EGD that was normal. She stopped Trulicity  and that medication cessation stopped her abdominal distress she was experiencing. She reports since stopping her Trulicity , her blood sugars and weight have been increasing. Her lasts HbgA1c was 6.1% Reinforced diet control and exercise as tolerated.  She reports finishing her antibiotics for UTI but endorses still having urinary urgency. Patient is due for routine blood work but she is not fasting today. Will order for future labs and return fasting. Will also check UA/urine culture and UAC today.  She has history of chronic stress, GAD and Major Depression. Under care of Psychiatrist, psychologist and Lifestyle coach.  She denies any additional complaints at this time.       No other concerns at this time.   Past Medical History:  Diagnosis Date   Acute vaginitis 11/12/2022   Anxiety    Basal cell carcinoma 2024   L shoulder, txted at [facility] (chronic calculous cholecystitis) 02/04/2024   Depression    Depression    Diabetes mellitus without complication (HCC)    type 2   GERD (gastroesophageal reflux disease)    before bypass surgery   Heart murmur    managed by PCP no medications   Hyperlipidemia    Hypertension    Morbid obesity (HCC) 02/10/2018   Nonrheumatic mitral valve regurgitation 09/11/2022   Plantar fasciitis    Sleep apnea    does not use CPAP   Vitamin B12 deficiency 12/25/2022   Vitamin D  deficiency 12/25/2022    Past Surgical History:  Procedure Laterality Date   BARIATRIC SURGERY     CESAREAN SECTION      COLONOSCOPY N/A    COLONOSCOPY N/A 02/25/2024   Procedure: COLONOSCOPY;  Surgeon: Unk Corinn Skiff, MD;  Location: [facility];  Service: Endoscopy;  Laterality: N/A;   ESOPHAGOGASTRODUODENOSCOPY N/A 02/25/2024   Procedure: EGD (ESOPHAGOGASTRODUODENOSCOPY);  Surgeon: Unk Corinn Skiff, MD;  Location: [facility];  Service: Endoscopy;  Laterality: N/A;   INDOCYANINE GREEN FLUORESCENCE IMAGING (ICG)  03/04/2024   Procedure: INDOCYANINE GREEN FLUORESCENCE IMAGING (ICG);  Surgeon: Lane Shope, MD;  Location: [facility];  Service: General;;    Social History   Socioeconomic History   Marital status: Married    Spouse name: Evalene   Number of children: Not on file   Years of education: Not on file   Highest education level: Not on file  Occupational History   Not on file  Tobacco Use   Smoking status: Former    Current packs/day: 0.00    Types: Cigarettes    Quit date: 1998    Years since quitting: 27.9   Smokeless tobacco: Never  Vaping Use   Vaping status: Never Used  Substance and Sexual Activity   Alcohol use: Yes    Comment: 1-2 glasses of wine or mixed drink   Drug use: Not Currently    Types: Marijuana, Cocaine, Crack cocaine, Other-see comments, Amphetamines    Comment: in late teens   Sexual activity: Not on file  Other Topics Concern   Not  on file  Social History Narrative   Not on file   Social Drivers of Health   Financial Resource Strain: Low Risk  (02/18/2024)   Received from [facility]   Overall Financial Resource Strain (CARDIA)    Difficulty of Paying Living Expenses: Not hard at all  Food Insecurity: No Food Insecurity (02/18/2024)   Received from [facility]   Hunger Vital Sign    Within the past 12 months, you worried that your food would run out before you got the money to buy more.: Never true    Within the past 12 months, the food you bought just didn't last and you didn't have money to get  more.: Never true  Transportation Needs: No Transportation Needs (02/18/2024)   Received from [facility] - Transportation    In the past 12 months, has lack of transportation kept you from medical appointments or from getting medications?: No    Lack of Transportation (Non-Medical): No  Physical Activity: Not on file  Stress: Not on file  Social Connections: Not on file  Intimate Partner Violence: Not on file    Family History  Problem Relation Age of Onset   Arthritis Mother    Cancer Father        Melanoma   Diabetes Father    Cancer Brother        Throat   Arthritis Paternal Grandmother    Breast cancer Neg Hx    Bladder Cancer Neg Hx    Kidney cancer Neg Hx     Allergies  Allergen Reactions   Macrobid  [Nitrofurantoin ] Nausea Only   Tape Rash    Okay to use bandaids and paper tape    Outpatient Medications Prior to Visit  Medication Sig   acetaminophen (TYLENOL) 500 MG tablet Take by mouth.   amLODipine  (NORVASC ) 5 MG tablet Take 1 tablet (5 mg total) by mouth daily.   b complex vitamins capsule Take 1 capsule by mouth daily. (Patient taking differently: Take 1 capsule by mouth once a week.)   Cholecalciferol (VITAMIN D3) 50 MCG (2000 UT) capsule Take 2,000 Units by mouth daily.   cloNIDine  (CATAPRES ) 0.3 MG tablet Take 0.15 mg by mouth daily.   DULoxetine (CYMBALTA) 60 MG capsule    glimepiride  (AMARYL ) 2 MG tablet TAKE 1 TABLET BY MOUTH ONCE DAILY WITH BREAKFAST   hydrOXYzine (ATARAX) 50 MG tablet Take 50 mg by mouth every 8 (eight) hours as needed for anxiety.   ibuprofen (ADVIL) 800 MG tablet Take 1 tablet (800 mg total) by mouth every 8 (eight) hours as needed.   Multiple Vitamin (MULTIVITAMIN) capsule Take by mouth.   ondansetron (ZOFRAN-ODT) 4 MG disintegrating tablet Take 1 tablet (4 mg total) by mouth every 8 (eight) hours as needed for nausea or vomiting.   oxyCODONE (OXY IR/ROXICODONE) 5 MG immediate release tablet Take 1 tablet  (5 mg total) by mouth every 6 (six) hours as needed for severe pain (pain score 7-10).   pantoprazole  (PROTONIX ) 40 MG tablet Take 1 tablet (40 mg total) by mouth daily.   Probiotic Product (RA PROBIOTIC GUMMIES PO) Take 2 capsules by mouth daily.   QUEtiapine (SEROQUEL) 25 MG tablet Take 25 mg by mouth at bedtime.   sertraline (ZOLOFT) 100 MG tablet Take 100 mg by mouth daily.   simvastatin  (ZOCOR ) 20 MG tablet TAKE ONE TABLET BY MOUTH EVERY DAY (Patient taking differently: Take 10 mg by mouth daily.)  tacrolimus  (PROTOPIC ) 0.1 % ointment Apply topically as directed. qd to bid to aa rash lower abdomen and under breast prn flares   Turmeric 400 MG CAPS Take by mouth.   No facility-administered medications prior to visit.    Review of Systems  Constitutional: Negative.  Negative for chills, fever and malaise/fatigue.  HENT: Negative.  Negative for congestion and sore throat.   Eyes: Negative.  Negative for blurred vision and pain.  Respiratory: Negative.  Negative for cough and shortness of breath.   Cardiovascular: Negative.  Negative for chest pain, palpitations and leg swelling.  Gastrointestinal: Negative.  Negative for abdominal pain, blood in stool, constipation, diarrhea, heartburn, melena, nausea and vomiting.  Genitourinary:  Positive for dysuria and urgency. Negative for flank pain and frequency.  Musculoskeletal: Negative.  Negative for joint pain and myalgias.  Skin: Negative.   Neurological: Negative.  Negative for dizziness, tingling, sensory change, weakness and headaches.  Endo/Heme/Allergies: Negative.   Psychiatric/Behavioral:  Negative for depression and suicidal ideas. The patient is nervous/anxious.        Objective:   BP 114/78   Pulse 91   Ht 5' 5 (1.651 m)   Wt 216 lb 9.6 oz (98.2 kg)   SpO2 96%   BMI 36.04 kg/m   Vitals:   04/09/24 1130  BP: 114/78  Pulse: 91  Height: 5' 5 (1.651 m)  Weight: 216 lb 9.6 oz (98.2 kg)  SpO2: 96%  BMI (Calculated):  36.04    Physical Exam Vitals and nursing note reviewed.  Constitutional:      Appearance: Normal appearance.  HENT:     Head: Normocephalic and atraumatic.     Nose: Nose normal.     Mouth/Throat:     Mouth: Mucous membranes are moist.     Pharynx: Oropharynx is clear.  Eyes:     Conjunctiva/sclera: Conjunctivae normal.     Pupils: Pupils are equal, round, and reactive to light.  Cardiovascular:     Rate and Rhythm: Normal rate and regular rhythm.     Pulses: Normal pulses.     Heart sounds: Murmur heard.  Pulmonary:     Effort: Pulmonary effort is normal.     Breath sounds: Normal breath sounds. No wheezing.  Abdominal:     General: Bowel sounds are normal.     Palpations: Abdomen is soft.     Tenderness: There is no abdominal tenderness. There is no right CVA tenderness or left CVA tenderness.  Musculoskeletal:        General: Normal range of motion.     Cervical back: Normal range of motion.     Right lower leg: No edema.     Left lower leg: No edema.  Skin:    General: Skin is warm and dry.  Neurological:     General: No focal deficit present.     Mental Status: She is alert and oriented to person, place, and time.  Psychiatric:        Mood and Affect: Mood normal.        Behavior: Behavior normal.      Results for orders placed or performed in visit on 04/09/24  POCT CBG (Fasting - Glucose)  Result Value Ref Range   Glucose Fasting, POC 265 (A) 70 - 99 mg/dL  POCT Urinalysis Dipstick (18997)  Result Value Ref Range   Color, UA Yellow    Clarity, UA Clear    Glucose, UA Negative Negative   Bilirubin, UA Negative  Ketones, UA Negative    Spec Grav, UA >=1.030 (A) 1.010 - 1.025   Blood, UA Trace    pH, UA 5.5 5.0 - 8.0   Protein, UA Negative Negative   Urobilinogen, UA 1.0 0.2 or 1.0 E.U./dL   Nitrite, UA Negative    Leukocytes, UA Negative Negative   Appearance Clear    Odor Yes   POC CREATINE & ALBUMIN,URINE  Result Value Ref Range    Microalbumin Ur, POC 80 mg/L   Creatinine, POC 200 mg/dL   Albumin/Creatinine Ratio, Urine, POC 30-300     Recent Results (from the past 2160 hours)  Surgical pathology     Status: None   Collection Time: 02/25/24 12:00 AM  Result Value Ref Range   SURGICAL PATHOLOGY      SURGICAL PATHOLOGY [facility] [address] [place], [place] [address] Telephone [phone] or [phone] Fax [phone]  REPORT OF SURGICAL PATHOLOGY   Accession #: [id number] Patient Name: ALAYNA, MABE Visit # : [id number]  MRN: [id number] Physician: Unk Cotton DOB/Age [birth year] (Age: 45) Gender: F Collected Date: 02/25/2024 Received Date: 02/25/2024  FINAL DIAGNOSIS       1. Stomach, biopsy, Random :       - GASTRIC ANTRAL AND OXYNTIC MUCOSA WITH NO SPECIFIC HISTOPATHOLOGIC CHANGES      - SCANT FRAGMENT OF BENIGN UNREMARKABLE SMALL INTESTINAL MUCOSA      - HELICOBACTER PYLORI-LIKE ORGANISMS ARE NOT IDENTIFIED ON ROUTINE H&E STAIN       2. Esophagus, biopsy,  :       - ESOPHAGEAL SQUAMOUS MUCOSA WITH MILD VASCULAR CONGESTION, AND FOCAL SQUAMOUS      BALLOONING, SUGGESTIVE OF REFLUX ESOPHAGITIS      - NEGATIVE FOR INCREASED INTRAEPITHELIAL EOSINOPHILS       ELECTRONIC SIGNATURE : Kashikar Md, Nilesh, Patho logist, International Aid/development Worker  MICROSCOPIC DESCRIPTION  CASE COMMENTS STAINS USED IN DIAGNOSIS: H&E H&E    CLINICAL HISTORY  SPECIMEN(S) OBTAINED 1. Stomach, biopsy, Random 2. Esophagus, biopsy,  SPECIMEN COMMENTS: SPECIMEN CLINICAL INFORMATION: 1. Epigastric pain, esophageal dysphagia, CCA screen    Gross Description 1. Received in formalin labeled with the patient's name and Random stomach biopsy are three 0.1-0.5 cm pieces of tan soft tissue, submitted in toto in a single cassette.([facility], 02/26/2024) 2. Received in formalin labeled with the patient's name and Esophagus biopsy are five 0.3 cm pieces of tan soft tissue,  submitted in toto in a single cassette.(LE[facility], 02/26/2024)        Report signed out from the following location(s) [place]. [facility] [address] [place], [place] [address] CLIA #: [id number]  [facility] [address] [place], [place] [address] CLIA #: [id number]   Glucose, capillary     Status: Abnormal   Collection Time: 02/25/24 10:21 AM  Result Value Ref Range   Glucose-Capillary 157 (H) 70 - 99 mg/dL    Comment: Glucose reference range applies only to samples taken after fasting for at least 8 hours.  Basic metabolic panel per protocol     Status: Abnormal   Collection Time: 02/27/24  2:40 PM  Result Value Ref Range   Sodium 140 135 - 145 mmol/L   Potassium 4.2 3.5 - 5.1 mmol/L   Chloride 103 98 - 111 mmol/L   CO2 22 22 - 32 mmol/L   Glucose, Bld 159 (H) 70 - 99 mg/dL    Comment: Glucose reference range applies only to samples taken  after fasting for at least 8 hours.   BUN 14 8 - 23 mg/dL   Creatinine, Ser 9.14 0.44 - 1.00 mg/dL   Calcium 9.4 8.9 - 89.6 mg/dL   GFR, Estimated >39 >39 mL/min    Comment: (NOTE) Calculated using the CKD-EPI Creatinine Equation (2021)    Anion gap 15 5 - 15    Comment: Performed at [facility], [address] [place], [place] [address]  CBC per protocol     Status: Abnormal   Collection Time: 02/27/24  2:40 PM  Result Value Ref Range   WBC 4.9 4.0 - 10.5 K/uL   RBC 4.29 3.87 - 5.11 MIL/uL   Hemoglobin 13.3 12.0 - 15.0 g/dL   HCT 61.1 63.9 - 53.9 %   MCV 90.4 80.0 - 100.0 fL   MCH 31.0 26.0 - 34.0 pg   MCHC 34.3 30.0 - 36.0 g/dL   RDW 87.8 88.4 - 84.4 %   Platelets 144 (L) 150 - 400 K/uL   nRBC 0.0 0.0 - 0.2 %    Comment: Performed at [facility], [address] [place], [place] [address]  Surgical pathology     Status: None   Collection Time: 03/04/24 12:00 AM  Result Value Ref Range   SURGICAL PATHOLOGY      SURGICAL PATHOLOGY [facility] [address] [place], [place] [address] Telephone [phone] or [phone] Fax [phone]  REPORT OF SURGICAL PATHOLOGY   Accession #: [id number] Patient Name: MADYN, IVINS Visit # : [id number]  MRN: [id number] Physician: Lane Shope DOB/Age [birth year] (Age: 23) Gender: F Collected Date: 03/04/2024 Received Date: 03/04/2024  FINAL DIAGNOSIS       1. Gallbladder,  :       MILD CHRONIC CHOLECYSTITIS WITH CHOLELITHIASIS       DATE SIGNED OUT: 03/05/2024 ELECTRONIC SIGNATURE : Picklesimer Md, Fred , Sports Administrator, Electronic Signature  MICROSCOPIC DESCRIPTION  CASE COMMENTS STAINS USED IN DIAGNOSIS: H&E    CLINICAL HISTORY  SPECIMEN(S) OBTAINED 1. Gallbladder,  SPECIMEN COMMENTS: SPECIMEN CLINICAL INFORMATION: 1. Chronic calculous cholecystitis    Gross Description 1. Size/?Intact: 7.9 x 3.6 x 3.6 cm and intact.      Serosal surface: Tan-purple and smooth with an  area lacking serosa.      Mucosa/Wall: Green-brown, granular mucosa and a 0.1 cm wall throughout.      Contents: Green bile and one black-green, bosselated 0.6 cm gallstone.      Cystic duct: Patent      Block Summary:      1A: Cystic duct (patent) and representative sections of gallbladder body and      fundus      cc/mb 03-04-24        Report signed out from the following location(s) [place]. [facility] [address] [place], [place] [address] CLIA #: [id number]  [facility] [address] [place], [place] [address] CLIA #: [id number]   Glucose, capillary     Status: Abnormal   Collection Time: 03/04/24  6:37 AM  Result Value Ref Range   Glucose-Capillary 192 (H) 70 - 99 mg/dL    Comment: Glucose reference range applies only to samples taken after fasting for at least 8 hours.   Comment 1 Notify RN    Comment 2 Document in Chart   Glucose, capillary     Status: Abnormal   Collection Time: 03/04/24  8:53 AM  Result Value Ref Range  Glucose-Capillary 205 (H) 70 - 99 mg/dL    Comment: Glucose reference range applies only to samples taken after fasting for at least 8 hours.  Glucose, capillary     Status: Abnormal   Collection Time: 03/04/24  9:41 AM  Result Value Ref Range   Glucose-Capillary 216 (H) 70 - 99 mg/dL    Comment: Glucose reference range applies only to samples taken after fasting for at least 8 hours.  POCT Urinalysis Dipstick (18997)     Status: Abnormal   Collection Time: 03/31/24  1:20 PM  Result Value Ref Range   Color, UA Yellow    Clarity, UA Cloudy    Glucose, UA Negative Negative   Bilirubin, UA Negative    Ketones, UA Negative    Spec Grav, UA 1.020 1.010 - 1.025   Blood, UA Moderate    pH, UA 6.5 5.0 - 8.0   Protein, UA Positive (A) Negative   Urobilinogen, UA 1.0 0.2 or 1.0 E.U./dL   Nitrite, UA Negative    Leukocytes, UA Large (3+) (A) Negative   Appearance Cloudy    Odor Yes   Urine Culture     Status: Abnormal   Collection Time: 03/31/24  3:38 PM   Specimen: Urine   UR  Result Value Ref Range   Urine Culture, Routine Final report (A)    Organism ID, Bacteria Escherichia coli (A)     Comment: Cefazolin  with an MIC <=16 predicts susceptibility to the oral agents cefaclor, cefdinir, cefpodoxime, cefprozil, cefuroxime, cephalexin, and loracarbef when used for therapy of uncomplicated urinary tract infections due to E. coli, Klebsiella pneumoniae, and Proteus mirabilis. Multi-Drug Resistant Organism Greater than 100,000 colony forming units per mL    Antimicrobial Susceptibility Comment     Comment:       ** S = Susceptible; I = Intermediate; R = Resistant **                    P = Positive; N = Negative             MICS are expressed in micrograms per mL    Antibiotic                 RSLT#1    RSLT#2    RSLT#3    RSLT#4 Amoxicillin /Clavulanic Acid    S Ampicillin                     R Cefazolin                       S Cefepime                       S Cefoxitin                       S Cefpodoxime                    S Ceftriaxone                    S Ciprofloxacin                   S Ertapenem                      S Gentamicin  S Levofloxacin                    I Meropenem                      S Nitrofurantoin                  S Piperacillin/Tazobactam        S Tetracycline                   S Tobramycin                     S Trimethoprim/Sulfa             R   Lipase     Status: None   Collection Time: 04/07/24  9:33 AM  Result Value Ref Range   Lipase 69 14 - 72 U/L  POCT CBG (Fasting - Glucose)     Status: Abnormal   Collection Time: 04/09/24 11:36 AM  Result Value Ref Range   Glucose Fasting, POC 265 (A) 70 - 99 mg/dL  POCT Urinalysis Dipstick (18997)     Status: Abnormal   Collection Time: 04/09/24 11:39 AM  Result Value Ref Range   Color, UA Yellow    Clarity, UA Clear    Glucose, UA Negative Negative   Bilirubin, UA Negative    Ketones, UA Negative    Spec Grav, UA >=1.030 (A) 1.010 - 1.025   Blood, UA Trace    pH, UA 5.5 5.0 - 8.0   Protein, UA Negative Negative   Urobilinogen, UA 1.0 0.2 or 1.0 E.U./dL   Nitrite, UA Negative    Leukocytes, UA Negative Negative   Appearance Clear    Odor Yes   POC CREATINE & ALBUMIN,URINE     Status: Abnormal   Collection Time: 04/09/24 11:55 AM  Result Value Ref Range   Microalbumin Ur, POC 80 mg/L   Creatinine, POC 200 mg/dL   Albumin/Creatinine Ratio, Urine, POC 30-300       Assessment & Plan:  Reinforced healthy diet and exercise as tolerated. Check routine labs when fasting. UA/culture today and UAC. Continue other medications as prescribed. 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   Total time spent: 25 minutes. This time includes review of previous notes and results and patient face to face interaction during today's visit.    FERNAND FREDY RAMAN, MD  04/09/2024   This document may have been prepared by [facility] Voice Recognition software and as such may include unintentional dictation errors.

## 2024-04-11 LAB — URINE CULTURE

## 2024-04-13 ENCOUNTER — Ambulatory Visit: Admitting: Physician Assistant

## 2024-04-13 ENCOUNTER — Other Ambulatory Visit

## 2024-04-13 ENCOUNTER — Encounter: Payer: Self-pay | Admitting: Physician Assistant

## 2024-04-13 VITALS — BP 143/81 | HR 82 | Ht 65.0 in | Wt 216.0 lb

## 2024-04-13 DIAGNOSIS — E1159 Type 2 diabetes mellitus with other circulatory complications: Secondary | ICD-10-CM

## 2024-04-13 DIAGNOSIS — R1010 Upper abdominal pain, unspecified: Secondary | ICD-10-CM

## 2024-04-13 DIAGNOSIS — E559 Vitamin D deficiency, unspecified: Secondary | ICD-10-CM

## 2024-04-13 DIAGNOSIS — N390 Urinary tract infection, site not specified: Secondary | ICD-10-CM | POA: Diagnosis not present

## 2024-04-13 DIAGNOSIS — E1165 Type 2 diabetes mellitus with hyperglycemia: Secondary | ICD-10-CM | POA: Diagnosis not present

## 2024-04-13 DIAGNOSIS — I152 Hypertension secondary to endocrine disorders: Secondary | ICD-10-CM | POA: Diagnosis not present

## 2024-04-13 DIAGNOSIS — E1169 Type 2 diabetes mellitus with other specified complication: Secondary | ICD-10-CM | POA: Diagnosis not present

## 2024-04-13 DIAGNOSIS — E782 Mixed hyperlipidemia: Secondary | ICD-10-CM | POA: Diagnosis not present

## 2024-04-13 LAB — URINALYSIS, COMPLETE
Bilirubin, UA: NEGATIVE
Ketones, UA: NEGATIVE
Leukocytes,UA: NEGATIVE
Nitrite, UA: NEGATIVE
Protein,UA: NEGATIVE
Specific Gravity, UA: 1.025 (ref 1.005–1.030)
Urobilinogen, Ur: 1 mg/dL (ref 0.2–1.0)
pH, UA: 6 (ref 5.0–7.5)

## 2024-04-13 LAB — MICROSCOPIC EXAMINATION: Bacteria, UA: NONE SEEN

## 2024-04-13 MED ORDER — ESTRADIOL 0.01 % VA CREA: TOPICAL_CREAM | VAGINAL | 12 refills | Status: AC

## 2024-04-13 NOTE — Progress Notes (Signed)
 04/13/2024 3:05 PM   Sandra Le 1961/02/21 [id number]  CC: Chief Complaint  Patient presents with   Recurrent UTI   HPI: Sandra Le is a 63 y.o. female with PMH microscopic hematuria with benign workup in 2019 and recurrent versus persistent UTI earlier this year who presents today for evaluation of recurrent UTI.   Today she reports 3-4 UTIs yearly, which are frustrating for her.  She completed her most recent course of antibiotics last week, managed by her PCP.  She is already taking a daily probiotic with cranberry as well as cranberry supplements twice daily for UTI prevention.  She wonders about augmenting with estrogen cream.  Urine culture history as follows: 04/17/2024: Mixed urogenital flora 12/25/2023: Ampicillin resistant Klebsiella pneumoniae 09/23/2023: Mixed urogenital flora 08/21/2023: Ampicillin and Bactrim resistant E. coli  In-office UA today positive for trace glucose and 1+ blood; urine microscopy with 3-10 RBCs/HPF.  PMH: Past Medical History:  Diagnosis Date   Acute vaginitis 11/12/2022   Anxiety    Basal cell carcinoma 2024   L shoulder, txted at [facility] (chronic calculous cholecystitis) 02/04/2024   Depression    Depression    Diabetes mellitus without complication (HCC)    type 2   GERD (gastroesophageal reflux disease)    before bypass surgery   Heart murmur    managed by PCP no medications   Hyperlipidemia    Hypertension    Morbid obesity (HCC) 02/10/2018   Nonrheumatic mitral valve regurgitation 09/11/2022   Plantar fasciitis    Sleep apnea    does not use CPAP   Vitamin B12 deficiency 12/25/2022   Vitamin D  deficiency 12/25/2022    Surgical History: Past Surgical History:  Procedure Laterality Date   BARIATRIC SURGERY     CESAREAN SECTION     COLONOSCOPY N/A    COLONOSCOPY N/A 02/25/2024   Procedure: COLONOSCOPY;  Surgeon: Unk Corinn Skiff, MD;  Location: [facility];  Service: Endoscopy;   Laterality: N/A;   ESOPHAGOGASTRODUODENOSCOPY N/A 02/25/2024   Procedure: EGD (ESOPHAGOGASTRODUODENOSCOPY);  Surgeon: Unk Corinn Skiff, MD;  Location: [facility];  Service: Endoscopy;  Laterality: N/A;   INDOCYANINE GREEN  FLUORESCENCE IMAGING (ICG)  03/04/2024   Procedure: INDOCYANINE GREEN  FLUORESCENCE IMAGING (ICG);  Surgeon: Lane Shope, MD;  Location: [facility];  Service: General;;    Home Medications:  Allergies as of 04/13/2024       Reactions   Macrobid  [nitrofurantoin ] Nausea Only   Tape Rash   Okay to use bandaids and paper tape        Medication List        Accurate as of April 13, 2024  3:05 PM. If you have any questions, ask your nurse or doctor.          acetaminophen  500 MG tablet Commonly known as: TYLENOL  Take by mouth.   amLODipine  5 MG tablet Commonly known as: NORVASC  Take 1 tablet (5 mg total) by mouth daily.   b complex vitamins capsule Take 1 capsule by mouth daily. What changed: when to take this   cloNIDine  0.3 MG tablet Commonly known as: CATAPRES  Take 0.15 mg by mouth daily.   DULoxetine 60 MG capsule Commonly known as: CYMBALTA   glimepiride  2 MG tablet Commonly known as: AMARYL  TAKE 1 TABLET BY MOUTH ONCE DAILY WITH BREAKFAST   hydrOXYzine 50 MG tablet Commonly known as: ATARAX Take 50 mg by mouth every 8 (eight) hours as needed for anxiety.   ibuprofen  800  MG tablet Commonly known as: ADVIL  Take 1 tablet (800 mg total) by mouth every 8 (eight) hours as needed.   multivitamin capsule Take by mouth.   ondansetron  4 MG disintegrating tablet Commonly known as: ZOFRAN -ODT Take 1 tablet (4 mg total) by mouth every 8 (eight) hours as needed for nausea or vomiting.   oxyCODONE  5 MG immediate release tablet Commonly known as: Oxy IR/ROXICODONE  Take 1 tablet (5 mg total) by mouth every 6 (six) hours as needed for severe pain (pain score 7-10).   pantoprazole  40 MG tablet Commonly known as: Protonix  Take 1  tablet (40 mg total) by mouth daily.   QUEtiapine 25 MG tablet Commonly known as: SEROQUEL Take 25 mg by mouth at bedtime.   RA PROBIOTIC GUMMIES PO Take 2 capsules by mouth daily.   simvastatin  20 MG tablet Commonly known as: ZOCOR  TAKE ONE TABLET BY MOUTH EVERY DAY What changed: how much to take   tacrolimus  0.1 % ointment Commonly known as: PROTOPIC  Apply topically as directed. qd to bid to aa rash lower abdomen and under breast prn flares   Turmeric 400 MG Caps Take by mouth.   Vitamin D3 50 MCG (2000 UT) capsule Take 2,000 Units by mouth daily.   Zoloft 100 MG tablet Generic drug: sertraline Take 100 mg by mouth daily.        Allergies:  Allergies  Allergen Reactions   Macrobid  [Nitrofurantoin ] Nausea Only   Tape Rash    Okay to use bandaids and paper tape    Family History: Family History  Problem Relation Age of Onset   Arthritis Mother    Cancer Father        Melanoma   Diabetes Father    Cancer Brother        Throat   Arthritis Paternal Grandmother    Breast cancer Neg Hx    Bladder Cancer Neg Hx    Kidney cancer Neg Hx     Social History:   reports that she quit smoking about 27 years ago. Her smoking use included cigarettes. She has never used smokeless tobacco. She reports current alcohol use. She reports that she does not currently use drugs after having used the following drugs: Marijuana, Cocaine, Crack cocaine, Other-see comments, and Amphetamines.  Physical Exam: BP (!) 143/81   Pulse 82   Ht 5' 5 (1.651 m)   Wt 216 lb (98 kg)   BMI 35.94 kg/m   Constitutional:  Alert and oriented, no acute distress, nontoxic appearing HEENT: [place], AT Cardiovascular: No clubbing, cyanosis, or edema Respiratory: Normal respiratory effort, no increased work of breathing Skin: No rashes, bruises or suspicious lesions Neurologic: Grossly intact, no focal deficits, moving all 4 extremities Psychiatric: Normal mood and affect  Laboratory Data: See  Epic  Assessment & Plan:   1. Recurrent UTI (Primary) Asymptomatic today, UA bland.  Agree with cranberry supplements and probiotics.  Agree with starting estrogen cream.  We discussed that this requires long-term, continuous use for maximum effect.  No personal or family history of breast or GYN cancers. - Urinalysis, Complete - estradiol  (ESTRACE ) 0.01 % CREA vaginal cream; Apply one pea-sized amount around the opening of the urethra daily for 2 weeks, then 3 times weekly moving forward.  Dispense: 42.5 g; Refill: 12   Return in about 6 months (around 10/11/2024) for rUTI follow-up.  Lucie Hones, PA-C  [facility] [place] [address], [address] [place], [place] [address] [phone]

## 2024-04-13 NOTE — Progress Notes (Signed)
 Patient notified

## 2024-04-14 LAB — CMP14+EGFR
ALT: 15 [IU]/L (ref 0–32)
AST: 22 [IU]/L (ref 0–40)
Albumin: 3.9 g/dL (ref 3.9–4.9)
Alkaline Phosphatase: 82 [IU]/L (ref 49–135)
BUN/Creatinine Ratio: 12 (ref 12–28)
BUN: 9 mg/dL (ref 8–27)
Bilirubin Total: 0.5 mg/dL (ref 0.0–1.2)
CO2: 24 mmol/L (ref 20–29)
Calcium: 8.9 mg/dL (ref 8.7–10.3)
Chloride: 102 mmol/L (ref 96–106)
Creatinine, Ser: 0.73 mg/dL (ref 0.57–1.00)
Globulin, Total: 2.2 g/dL (ref 1.5–4.5)
Glucose: 131 mg/dL — ABNORMAL HIGH (ref 70–99)
Potassium: 4.2 mmol/L (ref 3.5–5.2)
Sodium: 139 mmol/L (ref 134–144)
Total Protein: 6.1 g/dL (ref 6.0–8.5)
eGFR: 92 mL/min/{1.73_m2}

## 2024-04-14 LAB — VITAMIN D 25 HYDROXY (VIT D DEFICIENCY, FRACTURES): Vit D, 25-Hydroxy: 45.6 ng/mL (ref 30.0–100.0)

## 2024-04-14 LAB — CBC WITH DIFFERENTIAL/PLATELET
Basophils Absolute: 0 10*3/uL (ref 0.0–0.2)
Basos: 1 %
EOS (ABSOLUTE): 0.2 10*3/uL (ref 0.0–0.4)
Eos: 4 %
Hematocrit: 38.7 % (ref 34.0–46.6)
Hemoglobin: 12.7 g/dL (ref 11.1–15.9)
Immature Grans (Abs): 0 10*3/uL (ref 0.0–0.1)
Immature Granulocytes: 0 %
Lymphocytes Absolute: 1.5 10*3/uL (ref 0.7–3.1)
Lymphs: 32 %
MCH: 31.1 pg (ref 26.6–33.0)
MCHC: 32.8 g/dL (ref 31.5–35.7)
MCV: 95 fL (ref 79–97)
Monocytes Absolute: 0.9 10*3/uL (ref 0.1–0.9)
Monocytes: 20 %
Neutrophils Absolute: 2 10*3/uL (ref 1.4–7.0)
Neutrophils: 43 %
Platelets: 154 10*3/uL (ref 150–450)
RBC: 4.09 x10E6/uL (ref 3.77–5.28)
RDW: 12.3 % (ref 11.7–15.4)
WBC: 4.6 10*3/uL (ref 3.4–10.8)

## 2024-04-14 LAB — HEMOGLOBIN A1C
Est. average glucose Bld gHb Est-mCnc: 166 mg/dL
Hgb A1c MFr Bld: 7.4 % — ABNORMAL HIGH (ref 4.8–5.6)

## 2024-04-14 LAB — LIPID PANEL W/O CHOL/HDL RATIO
Cholesterol, Total: 149 mg/dL (ref 100–199)
HDL: 60 mg/dL (ref >39)
LDL Chol Calc (NIH): 66 mg/dL (ref 0–99)
Triglycerides: 132 mg/dL (ref 0–149)
VLDL Cholesterol Cal: 23 mg/dL (ref 5–40)

## 2024-04-14 MED ORDER — GLIMEPIRIDE 4 MG PO TABS: 4.0000 mg | ORAL_TABLET | Freq: Every day | ORAL | 3 refills | Status: AC

## 2024-04-22 DIAGNOSIS — F331 Major depressive disorder, recurrent, moderate: Secondary | ICD-10-CM | POA: Diagnosis not present

## 2024-05-07 DIAGNOSIS — F331 Major depressive disorder, recurrent, moderate: Secondary | ICD-10-CM | POA: Diagnosis not present

## 2024-07-13 ENCOUNTER — Ambulatory Visit: Admitting: Internal Medicine

## 2024-08-07 ENCOUNTER — Ambulatory Visit: Admitting: Family Medicine

## 2024-09-15 ENCOUNTER — Encounter: Admitting: Dermatology

## 2024-10-13 ENCOUNTER — Ambulatory Visit: Admitting: Physician Assistant
